# Patient Record
Sex: Male | Born: 1955 | Race: White | Hispanic: No | Marital: Married | Smoking: Current every day smoker
Health system: Southern US, Community
[De-identification: ages and names within clinical notes are randomized; demographics above are authoritative.]

## PROBLEM LIST (undated history)

## (undated) DIAGNOSIS — I10 Essential (primary) hypertension: Secondary | ICD-10-CM

## (undated) DIAGNOSIS — E785 Hyperlipidemia, unspecified: Secondary | ICD-10-CM

## (undated) HISTORY — DX: Hyperlipidemia, unspecified: E78.5

## (undated) HISTORY — PX: HERNIA REPAIR: SHX51

## (undated) HISTORY — PX: COLONOSCOPY: SHX174

## (undated) HISTORY — DX: Essential (primary) hypertension: I10

## (undated) HISTORY — PX: CARDIAC SURGERY: SHX584

---

## 1999-11-30 ENCOUNTER — Inpatient Hospital Stay (HOSPITAL_COMMUNITY)
Admission: EM | Admit: 1999-11-30 | Discharge: 1999-12-04 | Payer: Self-pay | Admitting: Thoracic Surgery (Cardiothoracic Vascular Surgery)

## 1999-11-30 ENCOUNTER — Encounter: Payer: Self-pay | Admitting: Thoracic Surgery (Cardiothoracic Vascular Surgery)

## 1999-12-01 ENCOUNTER — Encounter: Payer: Self-pay | Admitting: Thoracic Surgery (Cardiothoracic Vascular Surgery)

## 1999-12-02 ENCOUNTER — Encounter: Payer: Self-pay | Admitting: Thoracic Surgery (Cardiothoracic Vascular Surgery)

## 1999-12-03 ENCOUNTER — Encounter: Payer: Self-pay | Admitting: Thoracic Surgery (Cardiothoracic Vascular Surgery)

## 2007-08-26 DIAGNOSIS — E7800 Pure hypercholesterolemia, unspecified: Secondary | ICD-10-CM | POA: Insufficient documentation

## 2009-07-15 ENCOUNTER — Ambulatory Visit: Payer: Self-pay | Admitting: Gastroenterology

## 2012-05-09 ENCOUNTER — Encounter: Payer: Self-pay | Admitting: Nurse Practitioner

## 2012-05-09 ENCOUNTER — Encounter: Payer: Self-pay | Admitting: Cardiothoracic Surgery

## 2012-06-02 ENCOUNTER — Encounter: Payer: Self-pay | Admitting: Nurse Practitioner

## 2012-06-02 ENCOUNTER — Encounter: Payer: Self-pay | Admitting: Cardiothoracic Surgery

## 2013-07-10 ENCOUNTER — Ambulatory Visit: Payer: Self-pay | Admitting: Family Medicine

## 2013-07-16 ENCOUNTER — Ambulatory Visit: Payer: Self-pay | Admitting: Family Medicine

## 2013-07-22 ENCOUNTER — Ambulatory Visit: Payer: Self-pay | Admitting: Urology

## 2013-07-23 ENCOUNTER — Ambulatory Visit: Payer: Self-pay | Admitting: Urology

## 2013-07-24 ENCOUNTER — Ambulatory Visit: Payer: Self-pay | Admitting: Urology

## 2013-08-07 ENCOUNTER — Ambulatory Visit: Payer: Self-pay | Admitting: Urology

## 2013-08-14 ENCOUNTER — Ambulatory Visit: Payer: Self-pay | Admitting: Urology

## 2013-09-03 ENCOUNTER — Ambulatory Visit: Payer: Self-pay | Admitting: Urology

## 2014-09-07 ENCOUNTER — Ambulatory Visit: Payer: Self-pay | Admitting: Urology

## 2014-10-01 ENCOUNTER — Ambulatory Visit
Admit: 2014-10-01 | Disposition: A | Discharge status: Home / Self Care | Payer: Self-pay | Attending: Urology | Admitting: Urology

## 2014-10-13 ENCOUNTER — Ambulatory Visit
Admit: 2014-10-13 | Disposition: A | Discharge status: Home / Self Care | Payer: Self-pay | Attending: Urology | Admitting: Urology

## 2014-10-30 ENCOUNTER — Ambulatory Visit
Admit: 2014-10-30 | Disposition: A | Discharge status: Home / Self Care | Payer: Self-pay | Attending: Urology | Admitting: Urology

## 2014-11-01 NOTE — Op Note (Signed)
PATIENT NAME:  Martin Franklin, Martin Franklin MR#:  [id number] DATE OF BIRTH:  02-25-1956  DATE OF SERVICE:  10/13/2014.  PREOPERATIVE DIAGNOSIS:  Right proximal ureteral stone.   POSTOPERATIVE DIAGNOSIS:  No stone identified.   PROCEDURE PERFORMED:  Right ureteroscopy, right ureteral stent exchange, right retrograde pyelogram.    ATTENDING SURGEON:  Claris GladdenAshley J. Kalid Ghan, MD.   ANESTHESIA:  General anesthesia.   ESTIMATED BLOOD LOSS:  Minimal.   DRAINS:  A 6 x 26 French double-J ureteral stent on string.   SPECIMENS:  None.   INDICATION:  This is a 59 year old male who previously presented to the Emergency Room with severe-onset right flank pain.  He was also found to have significant leukocytosis.  He was taken for emergent right ureteral stent placement for a 5 mm proximal stone and returns today for definitive management of his stone.  He has not seen the stone pass in the interim.  Risks and benefits of the procedure were explained in detail.  The patient agreed to proceed as planned.   PROCEDURE:  The patient was correctly identified in the preoperative holding area and informed consent was confirmed.  He was brought to the operating theatre and placed on the table in the supine position.  At this time, a universal timeout protocol was performed, and all team members were identified.  Venodyne boots were placed, and he was administered 500 mg of IV Levaquin in the perioperative period.  He was then placed under general anesthesia, repositioned lower on the bed in the dorsolithotomy position, and prepped and draped in the standard surgical fashion.  At this point in time, a rigid 22-French cystoscope was advanced per urethra into the bladder.  Attention was turned to the right side where a ureteral stent could be seen emanating from the right UO.  The distal tip of the stent was grasped and brought out to the level of the urethral meatus.  The stent was then cannulated using a 0.038 Sensor wire up to the level  of the kidney and the stent was removed.  A dual-lumen catheter was then used just within the distal ureter to introduce a second wire (one as a safety wire and the other as a working wire) up to the level of the kidney.  The safety wire was snapped in place.  I then attempted to advance a 7-French flexible ureteroscope up to the level of the kidney but was unable to get it past the ureteral orifice easily.  I then elected to use an access sheath which did slide easily over the wire to the level of the mid ureter.  The inner cannula was removed, and the flexible scope was able to be advanced up to the level of the kidney without difficulty.  At this point in time, a formal pyeloscopy was performed, and each and every calyx was directly visualized.  No stone was identified.  The scope was then backed down to the level of the proximal ureter and contrast was injected creating a map of the calyxes.  There was no hydronephrosis or filling defects noted. Each and every calyx was then directly visualized again using the map to ensure that all aspects of the kidney were directly visualized.  The stone was not identified.  The scope was then slowly backed down the ureter and the sheath was removed under direct visualization.  During this process, careful inspection of the ureter revealed absolutely no stones.  There was some friable and edematous ureteral mucosa but no  stone.  The scope and the sheath were completely removed at this point. The rigid cystoscope was then advanced back into the bladder to ensure that the ureter had no filling defects.  A second retrograde pyelogram was performed by introducing a 5-French open-ended ureteral catheter just within the UO.  Again, the ureter appeared delicate without any filling defects and no evidence of residual stone.  A 6 x 26 French double-J ureteral stent was then advanced over the wire to the level of the kidney.  The wire was partially withdrawn and a coil was noted within  the renal pelvis.  The wire was then fully withdrawn and coil was noted within the bladder.  The string was left on the stent.  The stent was left in place due to some mild mucosal bleeding and edema.  The bladder was drained.  The scope was removed.  The stent string was affixed to the glans penis using Mastisol and Tegaderm.  The patient was then repositioned in the supine position, reversed from anesthesia, and taken to the PACU in stable condition.  There were no complications in this case.      ____________________________ Claris Gladden, MD ajb:kc Franklin: 10/13/2014 16:41:35 ET T: 10/13/2014 17:01:32 ET JOB#: [id number]  cc: Claris Gladden, MD, <Dictator> Claris Gladden MD ELECTRONICALLY SIGNED 10/13/2014 18:20

## 2014-11-01 NOTE — Op Note (Signed)
PATIENT NAME:  Martin Franklin, Martin Franklin MR#:  [id number] DATE OF BIRTH:  Jun 11, 1956  DATE OF PROCEDURE:  09/07/2014  PREOPERATIVE DIAGNOSIS: Right ureteral stone, leukocytosis.   POSTOPERATIVE DIAGNOSIS:  Right ureteral stone, leukocytosis.  PROCEDURE PERFORMED: Cystoscopy,  right ureteral stent placement.   ATTENDING SURGEON: Claris GladdenAshley J. Ailani Governale, MD   ANESTHESIA: General anesthesia.   ESTIMATED BLOOD LOSS: Minimal.   DRAINS: A 6 x 26 French double-J ureteral stent on right.   COMPLICATIONS: None.   INDICATION: This is a 59 year old male who presented to the Emergency Room with severe acute onset of right flank pain, found to have a 5 mm proximal ureteral stone with moderate hydronephrosis, perinephric stranding and a leukocytosis to 20.  He was counseled on his various treatment options and elects to undergo ureteral stent placement. Risks and benefits of the procedure were explained in detail with the patient who agreed to proceed with planned procedure.   PROCEDURE IN DETAIL: The patient was correctly identified in preoperative holding area  and informed consent was confirmed. He was brought to the operating suite, placed on table in  supine position. At this time, universal timeout protocols were performed. All team members were identified, Venodyne boots placed, and he was administered 500 mg of IV Levaquin in the perioperative period. He was then placed under general anesthesia, repositioned lower on the bed in the dorsal lithotomy position and prepped and draped in standard surgical fashion.   At this point in time, the 5822 French rigid cystoscope was advanced per urethra into the bladder. Attention was turned to the right ureteral orifice which is cannulated using a 5 [place]French open-ended ureteral catheter and a sensor wire.  The sensor wire was able to be placed up to the kidney without difficulty. The 5 [place]French open-ended ureteral catheter was advanced up to the level of the UPJ and aspiration was  obtained and sent off for right urine culture. Of note, this urine was relatively clear, minimally purulent, and there was no evidence of purulent efflux from the right UO.   The 5 [place]French open-ended ureteral catheter was removed then, leaving only the sensor wire in place. A 6 x 26 French double-J ureteral stent was advanced over the wire to the level of the renal pelvis. The wire was partially withdrawn. A coil was noted within the renal pelvis. The wire was then fully withdrawn, a coil was noted within the bladder. The string was not left on the stent.   The patient's bladder was drained. The patient was repositioned in the supine position, reversed from anesthesia, and taken to the PACU in stable condition. There were no complications in this case.     ____________________________ Claris GladdenAshley J. Nivea Wojdyla, MD ajb:tr Franklin: 09/10/2014 11:12:29 ET T: 09/10/2014 11:40:41 ET JOB#: [id number]  cc: Claris GladdenAshley J. Maisie Hauser, MD, <Dictator> Claris GladdenASHLEY J Kinnedy Mongiello MD ELECTRONICALLY SIGNED 10/07/2014 16:21

## 2014-11-01 NOTE — Consult Note (Signed)
PATIENT NAME:  Martin Franklin, Martin Franklin MR#:  [id number] DATE OF BIRTH:  1955-07-16  DATE OF CONSULTATION:  09/07/2014  REFERRING PHYSICIAN:  Sharyn Creamer, MD (ED)  CONSULTING PHYSICIAN:  Claris Gladden, MD, urology.  REASON FOR CONSULTATION: Right ureteral stone, leukocytosis.   REQUESTING PHYSICIAN: The Emergency Room.   HISTORY OF PRESENT ILLNESS: This is a 59 year old male with a history of nephrolithiasis who presented with acute right flank pain.  The pain first started this morning around 10:00 a.m. associated with nausea and vomiting.  The pain was severe and poorly controlled and therefore he presented to the Emergency Room.  The pain was in the right flank but also radiated to the right lower quadrant.   Laboratory data revealed a significant leukocytosis of 20.1 with an increased neutrophil count.  His creatinine was 1.13.  Urinalysis was fairly unremarkable other than for 6 red blood cells and 2 white blood cells.  CT scan revealed a 5 mm proximal right ureteral stone with evidence of perinephric stranding.   The patient does have a known history of nephrolithiasis and was previously followed by Dr. Achilles Dunk of [facility] urology.  He underwent a left ESWL last year but had no stones prior to a year ago. He denies any urinary symptoms today including gross hematuria, urinary frequency or urgency. No fevers or chills.   PAST MEDICAL HISTORY:  1.  CAD status post CABG.  2.  Hypercholesterolemia.  3.  GERD.   PAST SURGICAL HISTORY:  1.  CABG. 2.  Inguinal hernia repair.  3.  ESWL.   MEDICATIONS:   1.  Unknown cholesterol medication 1 tablet daily.  2.  Centrum Silver daily vitamin 1 tab p.o. daily.  3.  Aspirin 81 mg p.o. daily.   ALLERGIES: No known drug allergies.   SOCIAL HISTORY:  He is a current smoker. He denies any excessive alcohol use and no illicit drug use. He presents today to the Emergency Room with his family.   REVIEW OF SYSTEMS:  An extensive 12 point review of systems was  performed and is negative other than as per history of present illness.   PHYSICAL EXAMINATION:  VITAL SIGNS:  Temperature 98.4, pulse 70, respirations 18, blood pressure 147/97, pulse oximetry 90% on room air.  GENERAL: No acute distress, alert and oriented x 3.  HEENT: Extraocular movements intact. Moist mucous membranes.  NECK: Supple. No masses.  Trachea is midline.  CHEST: Clear to auscultation bilaterally. No increased work of breathing or use of accessory muscles.  CARDIOVASCULAR: Regular rate and rhythm.  ABDOMEN: Soft, nontender, nondistended.  Mild right CVA tenderness and voluntary guarding in the right lower quadrant. No left CVA tenderness. NEUROLOGIC:  Cranial nerves grossly intact.  No focal deficits. PSYCHIATRIC: Appropriate affect interacting pleasantly. SKIN:  No rashes lesions or bruises.  EXTREMITIES: No lower extremity edema bilaterally.  LYMPHATICS: No cervical or inguinal lymphadenopathy.   LABORATORY DATA: As per history of present illness, CT scan was personally reviewed by myself which indeed did show a right obstructing 5 mm proximal right ureteral stone with right perinephric stranding and small fluid. The study is otherwise unremarkable.   ASSESSMENT AND PLAN: This is a 59 year old male with a 5 mm right proximal ureteral stone and leukocytosis to 20, given his poorly controlled pain as well leukocytosis, I do feel that placement of a right ureteral stent is warranted to both help with pain control, but as well as drain a possibly infected right kidney. We reviewed all of the  risks and benefits of the procedure and informed consent was obtained.  He understands that he will ultimately need a definitive procedure for his stone.  We discussed postoperative care for a stent as well as common postoperative symptoms including urinary frequency, urgency, hematuria and possible pain or dysuria with voiding.  He is willing to proceed as planned.  IV antibiotics are on-call to  the operating room.       ____________________________ Claris GladdenAshley J. Otha Rickles, MD ajb:DT D: 09/07/2014 18:43:30 ET T: 09/07/2014 19:43:53 ET JOB#: [id number]  cc: Claris GladdenAshley J. Bexlee Bergdoll, MD, <Dictator> Claris GladdenASHLEY J Faydra Korman MD ELECTRONICALLY SIGNED 10/13/2014 17:48

## 2015-02-17 ENCOUNTER — Other Ambulatory Visit: Payer: Self-pay

## 2015-02-17 DIAGNOSIS — N200 Calculus of kidney: Secondary | ICD-10-CM

## 2015-08-12 ENCOUNTER — Other Ambulatory Visit: Payer: Self-pay | Admitting: Family Medicine

## 2015-08-12 DIAGNOSIS — E7800 Pure hypercholesterolemia, unspecified: Secondary | ICD-10-CM

## 2015-08-12 NOTE — Telephone Encounter (Signed)
Lipids last checked March, 2015. Has been seen for acute visits in 2016 before change to Epic. Allene Dillon, CMA

## 2015-11-06 ENCOUNTER — Other Ambulatory Visit: Payer: Self-pay | Admitting: Family Medicine

## 2015-11-06 DIAGNOSIS — E7800 Pure hypercholesterolemia, unspecified: Secondary | ICD-10-CM

## 2015-11-12 ENCOUNTER — Ambulatory Visit: Payer: Self-pay | Admitting: Urology

## 2015-11-15 ENCOUNTER — Other Ambulatory Visit: Payer: Self-pay | Admitting: Family Medicine

## 2015-12-31 ENCOUNTER — Other Ambulatory Visit: Payer: Self-pay | Admitting: Family Medicine

## 2015-12-31 ENCOUNTER — Telehealth: Payer: Self-pay | Admitting: Emergency Medicine

## 2015-12-31 DIAGNOSIS — E7800 Pure hypercholesterolemia, unspecified: Secondary | ICD-10-CM

## 2015-12-31 MED ORDER — ATORVASTATIN CALCIUM 40 MG PO TABS: 40.0000 mg | ORAL_TABLET | Freq: Every day | ORAL | Status: DC

## 2015-12-31 NOTE — Telephone Encounter (Signed)
Pt is a Martin Franklin pt and is wanting to see you, he is needing a refill on his Atorvastatin and is completely out. Wife reports that she is going to schedule a wellness as they are due for employment any ways but would like to know if you will call in a 1 month supply until he can get in. Thanks.    [facility]

## 2015-12-31 NOTE — Telephone Encounter (Signed)
Pt informed

## 2015-12-31 NOTE — Telephone Encounter (Signed)
One month refill sent in

## 2015-12-31 NOTE — Telephone Encounter (Signed)
Last refill says patient was told he must have an office visit for anymore refills. He can have 30 tablets only. He must be seen by someone for any further refills.

## 2016-02-01 ENCOUNTER — Other Ambulatory Visit: Payer: Self-pay | Admitting: Family Medicine

## 2016-02-01 ENCOUNTER — Telehealth: Payer: Self-pay | Admitting: Family Medicine

## 2016-02-01 DIAGNOSIS — E7800 Pure hypercholesterolemia, unspecified: Secondary | ICD-10-CM

## 2016-02-01 MED ORDER — ATORVASTATIN CALCIUM 40 MG PO TABS: 40.0000 mg | ORAL_TABLET | Freq: Every day | ORAL | 0 refills | Status: DC

## 2016-02-01 NOTE — Telephone Encounter (Signed)
Refill request, KW 

## 2016-02-01 NOTE — Telephone Encounter (Signed)
Pt contacted office for refill request on the following medications:  atorvastatin (LIPITOR) 40 MG tablet.  [facility].  Pt has and appointment 02/04/16.  CB#[phone]/MW

## 2016-02-04 ENCOUNTER — Encounter: Payer: Self-pay | Admitting: Family Medicine

## 2016-02-04 ENCOUNTER — Ambulatory Visit (INDEPENDENT_AMBULATORY_CARE_PROVIDER_SITE_OTHER): Payer: Managed Care, Other (non HMO) | Admitting: Family Medicine

## 2016-02-04 VITALS — BP 140/80 | HR 74 | Temp 98.3°F | Resp 18 | Ht 70.0 in | Wt 215.6 lb

## 2016-02-04 DIAGNOSIS — I2510 Atherosclerotic heart disease of native coronary artery without angina pectoris: Secondary | ICD-10-CM | POA: Insufficient documentation

## 2016-02-04 DIAGNOSIS — Z87442 Personal history of urinary calculi: Secondary | ICD-10-CM

## 2016-02-04 DIAGNOSIS — Z0000 Encounter for general adult medical examination without abnormal findings: Secondary | ICD-10-CM | POA: Diagnosis not present

## 2016-02-04 DIAGNOSIS — Z720 Tobacco use: Secondary | ICD-10-CM | POA: Insufficient documentation

## 2016-02-04 DIAGNOSIS — R030 Elevated blood-pressure reading, without diagnosis of hypertension: Secondary | ICD-10-CM

## 2016-02-04 DIAGNOSIS — I10 Essential (primary) hypertension: Secondary | ICD-10-CM | POA: Insufficient documentation

## 2016-02-04 NOTE — Patient Instructions (Signed)
Discussed regular exercise 30 minutes daily. We will call you with the lab results.

## 2016-02-04 NOTE — Progress Notes (Signed)
Subjective:     Patient ID: Martin Franklin, male   DOB: Mar 05, 1956, 60 y.o.   MRN: [id number]  HPI  Chief Complaint  Patient presents with  . Annual Exam    Patient comes into office today for his anuual physical, he states that he has no questions or concerns today. Patient states that he is eating and sleeping well, he reports that he is actively exercising.   Works at [employer]. No other regular exercise.   Review of Systems General: Feeling well HEENT:edentulous with dentures; no recent eye exam but encouraged to do so. Cardiovascular: no chest pain, shortness of breath, or palpitations Respiratory: Continues to smoke and enjoys it. No desire to quit. GI: no heartburn, no change in bowel habits or blood in the stool. Normal colonoscopy in 2011. GU: nocturia x 0 no change in bladder habits  Psychiatric: not depressed Neurology: Reports one episode of transient right thigh paresthesias 3 months ago while working at a part time job. Musculoskeletal: no joint pain    Objective:   Physical Exam  Constitutional: He appears well-developed and well-nourished. No distress.  Eyes: PERRLA, EOMI Neck: no thyromegaly, tenderness or nodules, no cervical adenopathy or carotid bruits ENT: TM's intact without inflammation; No tonsillar enlargement or exudate, Lungs: Clear Heart : RRR without murmur or gallop Abd: bowel sounds present, soft, non-tender, no organomegaly Rectal: Prostate firm and non-tender-felt with tip of examiner's finger Extremities: no edema Skin: no atypical lesions noted on his back.     Assessment:    1. Annual physical exam - Comprehensive metabolic panel - Lipid panel - HgB A1c - PSA    Plan:    Further f/u pending lab work. Encourage regular exercise.

## 2016-02-05 LAB — COMPREHENSIVE METABOLIC PANEL
A/G RATIO: 1.7 (ref 1.2–2.2)
ALK PHOS: 127 IU/L — AB (ref 39–117)
ALT: 23 IU/L (ref 0–44)
AST: 23 IU/L (ref 0–40)
Albumin: 4.2 g/dL (ref 3.5–5.5)
BUN/Creatinine Ratio: 18 (ref 9–20)
BUN: 18 mg/dL (ref 6–24)
Bilirubin Total: 0.3 mg/dL (ref 0.0–1.2)
CO2: 20 mmol/L (ref 18–29)
Calcium: 8.7 mg/dL (ref 8.7–10.2)
Chloride: 102 mmol/L (ref 96–106)
Creatinine, Ser: 0.98 mg/dL (ref 0.76–1.27)
GFR calc Af Amer: 97 mL/min/{1.73_m2} (ref >59)
GFR calc non Af Amer: 84 mL/min/{1.73_m2} (ref >59)
GLOBULIN, TOTAL: 2.5 g/dL (ref 1.5–4.5)
Glucose: 98 mg/dL (ref 65–99)
POTASSIUM: 3.9 mmol/L (ref 3.5–5.2)
SODIUM: 138 mmol/L (ref 134–144)
Total Protein: 6.7 g/dL (ref 6.0–8.5)

## 2016-02-05 LAB — HEMOGLOBIN A1C
Est. average glucose Bld gHb Est-mCnc: 111 mg/dL
HEMOGLOBIN A1C: 5.5 % (ref 4.8–5.6)

## 2016-02-05 LAB — LIPID PANEL
CHOLESTEROL TOTAL: 140 mg/dL (ref 100–199)
Chol/HDL Ratio: 4 ratio units (ref 0.0–5.0)
HDL: 35 mg/dL — ABNORMAL LOW (ref >39)
LDL Calculated: 77 mg/dL (ref 0–99)
TRIGLYCERIDES: 140 mg/dL (ref 0–149)
VLDL Cholesterol Cal: 28 mg/dL (ref 5–40)

## 2016-02-05 LAB — PSA: PROSTATE SPECIFIC AG, SERUM: 0.8 ng/mL (ref 0.0–4.0)

## 2016-02-07 ENCOUNTER — Other Ambulatory Visit: Payer: Self-pay | Admitting: Family Medicine

## 2016-02-07 ENCOUNTER — Telehealth: Payer: Self-pay

## 2016-02-07 DIAGNOSIS — E785 Hyperlipidemia, unspecified: Secondary | ICD-10-CM

## 2016-02-07 MED ORDER — ATORVASTATIN CALCIUM 80 MG PO TABS: 80.0000 mg | ORAL_TABLET | Freq: Every day | ORAL | 3 refills | Status: DC

## 2016-02-07 NOTE — Telephone Encounter (Signed)
Advised patient he would like his prescription sent over to pharmacy. KW

## 2016-02-07 NOTE — Telephone Encounter (Signed)
Unable to reach patient at this time, voicemail box is not set up yet, will attempt to try and reach patient again this afternoon. KW

## 2016-02-07 NOTE — Telephone Encounter (Signed)
Increased atorvastatin to 80 mg.

## 2016-02-07 NOTE — Telephone Encounter (Signed)
-----   Message from Anola Gurneyobert Chauvin, [place] sent at 02/07/2016  7:53 AM EDT ----- Your labs look ok except your LDL cholesterol is not quite at goal < 70 for someone with cardiovascular disease. Would recommend we increased your atorvastatin to 80 mg. If you agree.

## 2016-02-11 ENCOUNTER — Other Ambulatory Visit: Payer: Self-pay | Admitting: Family Medicine

## 2016-02-11 DIAGNOSIS — E785 Hyperlipidemia, unspecified: Secondary | ICD-10-CM

## 2016-02-11 MED ORDER — ATORVASTATIN CALCIUM 80 MG PO TABS: 80.0000 mg | ORAL_TABLET | Freq: Every day | ORAL | 3 refills | Status: DC

## 2016-02-11 NOTE — Telephone Encounter (Signed)
Patient's Lipitor was changed to 80 mg this week.  He needs this called into [facility] please

## 2016-02-11 NOTE — Telephone Encounter (Signed)
done

## 2016-02-28 ENCOUNTER — Other Ambulatory Visit: Payer: Self-pay | Admitting: Family Medicine

## 2016-02-28 DIAGNOSIS — E7800 Pure hypercholesterolemia, unspecified: Secondary | ICD-10-CM

## 2016-12-02 ENCOUNTER — Other Ambulatory Visit: Payer: Self-pay | Admitting: Family Medicine

## 2016-12-02 DIAGNOSIS — E785 Hyperlipidemia, unspecified: Secondary | ICD-10-CM

## 2017-03-02 ENCOUNTER — Ambulatory Visit (INDEPENDENT_AMBULATORY_CARE_PROVIDER_SITE_OTHER): Payer: Managed Care, Other (non HMO) | Admitting: Family Medicine

## 2017-03-02 ENCOUNTER — Encounter: Payer: Self-pay | Admitting: Family Medicine

## 2017-03-02 VITALS — BP 136/92 | HR 83 | Temp 98.1°F | Resp 16 | Ht 69.5 in | Wt 206.4 lb

## 2017-03-02 DIAGNOSIS — E7800 Pure hypercholesterolemia, unspecified: Secondary | ICD-10-CM

## 2017-03-02 DIAGNOSIS — Z125 Encounter for screening for malignant neoplasm of prostate: Secondary | ICD-10-CM | POA: Diagnosis not present

## 2017-03-02 DIAGNOSIS — Z0000 Encounter for general adult medical examination without abnormal findings: Secondary | ICD-10-CM

## 2017-03-02 NOTE — Patient Instructions (Signed)
Please get your bp checked once or twice outside of the office. Goal is at least below 140/90 to  120/80. I will call you once I review your labs.

## 2017-03-02 NOTE — Progress Notes (Signed)
Subjective:     Patient ID: Martin Franklin, male   DOB: [birth year], 61 y.o.   MRN: [id number]  HPI  Chief Complaint  Patient presents with  . Annual Exam    Patient comes in office today for his annual physical he states that he is feeling well today and has no questions or concerns to address. Patient reports that he is following a well balanced diet, walking daily at work, sleeping average of 7hrs a night and reports that his libido is normal. Patient last Tdap was 03/06/12, Colonscopy 07/15/2009, PSA  02/04/16. Patient has declined flu vaccine today and states that he had labs drawn a week ago for biometric screen.   Defers flu shot as he "gets a cold every time I get the shot." Also does not wish the pneumovax. Continues to work at [employer] but wishes to retire at 2062.   Review of Systems General: Feeling well HEENT: edentulous with dentures in use, no recent eye exam and encouraged to do. Cardiovascular: no chest pain, shortness of breath, or palpitations Respiratory: Currently is tapering his cigarette use; currently from 2 packs to < 1 ppd-intends to quit. Reports vivid dreams with nicotine patch and does not use. GI: no heartburn, no change in bowel habits  GU: nocturia x 0, no change in bladder habits  Psychiatric: not depressed Musculoskeletal: no joint pain    Objective:   Physical Exam  Constitutional: He appears well-developed and well-nourished. No distress.  Eyes: PERRLA, EOMI Neck: no thyromegaly, tenderness or nodules, no cervical adenopathy or carotid bruits. ENT: TM's intact without inflammation; No tonsillar enlargement or exudate, Lungs: Clear Heart : RRR without murmur or gallop Abd: bowel sounds present, soft, non-tender, no organomegaly Rectal: Prostate firm and non-tender Extremities: no edema Skin: no atypical lesions noted on his back     Assessment:    1. Annual physical exam  2. Pure hypercholesterolemia: well controlled with medication   3.  Screening for prostate cancer - PSA    Plan:    Discussed bp checks outside of the office. Labs obtained and reviewed after patient departure-all look good- PSA not obtained. Will call patient and discuss labs.

## 2017-03-07 ENCOUNTER — Telehealth: Payer: Self-pay | Admitting: Family Medicine

## 2017-03-07 NOTE — Telephone Encounter (Signed)
Pt. Came in to tell you he had add on labs done today and for future reference the lab keeps the blood 7 days and that we couldve just added the test on to the blood they had.

## 2017-03-08 LAB — PSA: PROSTATE SPECIFIC AG, SERUM: 0.6 ng/mL (ref 0.0–4.0)

## 2017-03-09 ENCOUNTER — Encounter: Payer: Self-pay | Admitting: Family Medicine

## 2017-03-15 ENCOUNTER — Telehealth: Payer: Self-pay

## 2017-03-15 NOTE — Telephone Encounter (Signed)
-----   Message from Anola Gurneyobert Chauvin, [place] sent at 03/08/2017  1:19 PM EDT ----- PSA ok

## 2017-03-15 NOTE — Telephone Encounter (Signed)
Pt advised.   Thanks,   -Laura  

## 2017-04-30 ENCOUNTER — Other Ambulatory Visit: Payer: Self-pay | Admitting: Family Medicine

## 2017-04-30 ENCOUNTER — Telehealth: Payer: Self-pay | Admitting: Family Medicine

## 2017-04-30 MED ORDER — SILDENAFIL CITRATE 50 MG PO TABS: 50.0000 mg | ORAL_TABLET | Freq: Every day | ORAL | 0 refills | Status: AC | PRN

## 2017-04-30 NOTE — Telephone Encounter (Signed)
Please review

## 2017-04-30 NOTE — Telephone Encounter (Signed)
Pt is requesting Martin Franklin return his call. Pt wouldn't discuss why he just wanted to speak with Martin Franklin and that it wasn't an emergency. Please advise. Thanks TNP

## 2017-04-30 NOTE — Telephone Encounter (Signed)
States he has been having trouble sustaining erections. He has been estranged from his wife for 2 months and is getting back together again, Wishes Viagra as an aid. Recent PSA normal. Suggested he may not need this chronically. [facility] to [facility].

## 2017-07-02 ENCOUNTER — Encounter: Payer: Self-pay | Admitting: Family Medicine

## 2017-07-02 ENCOUNTER — Ambulatory Visit: Payer: Managed Care, Other (non HMO) | Admitting: Family Medicine

## 2017-07-02 VITALS — BP 128/82 | HR 84 | Temp 98.5°F | Resp 16 | Ht 70.0 in | Wt 207.0 lb

## 2017-07-02 DIAGNOSIS — J028 Acute pharyngitis due to other specified organisms: Secondary | ICD-10-CM

## 2017-07-02 LAB — POCT RAPID STREP A (OFFICE): Rapid Strep A Screen: NEGATIVE

## 2017-07-02 NOTE — Patient Instructions (Signed)
Discussed use of two Aleve twice daily with food and salt water gargles. Mucinex D for congestion and Delsym for cough.

## 2017-07-02 NOTE — Progress Notes (Signed)
Subjective:     Patient ID: Martin Franklin, male   DOB: [birth year], 61 y.o.   MRN: [id number] Chief Complaint  Patient presents with  . Sore Throat    Patient c/o sore throat X 3 days. He has used throat lozenges to help with his symptoms. He also has trouble swallowing. Denies fever. Mild cough. He also states that it is tender to touch.     HPI Reports contact with sick grandchildren. Minimal sinus congestion and cough. + tobacco use.  Review of Systems     Objective:   Physical Exam  Constitutional: He appears well-developed and well-nourished. No distress.  Ears: T.M's intact without inflammation Throat: no tonsillar enlargement, mild posterior pharyngeal erythema. Neck: bilateral anterior cervical nodes Lungs: clear     Assessment:    1. Pharyngitis due to other organism: suspect early URI - POCT rapid strep A    Plan:    Discussed salt water gargles and Aleve. For congestion Mucinex D and Delsym for cough.

## 2017-11-01 ENCOUNTER — Encounter: Payer: Self-pay | Admitting: Family Medicine

## 2017-11-01 ENCOUNTER — Ambulatory Visit: Payer: Managed Care, Other (non HMO) | Admitting: Family Medicine

## 2017-11-01 VITALS — BP 136/78 | HR 86 | Temp 98.9°F | Resp 16 | Wt 208.0 lb

## 2017-11-01 DIAGNOSIS — B349 Viral infection, unspecified: Secondary | ICD-10-CM

## 2017-11-01 NOTE — Patient Instructions (Signed)
Discussed Mucinex D for congestion and Delsym for cough. Let me know if sinuses not improving over the weekend.

## 2017-11-01 NOTE — Progress Notes (Signed)
  Subjective:     Patient ID: Martin Franklin, male   DOB: 06-Oct-1955, 62 y.o.   MRN: [id number] Chief Complaint  Patient presents with  . Joint Pain    Patient complains of pain in his knees, elbow and shoulder for the past 24hrs, patient describes it as being stiff.  . Cough    Patient complaints of productive cough with mucous and shortness of breath for the past 5 days, patient denies taking any otc cough suppresant or medication.    HPI Reports sinus congestion and sputum are clear. Feels fatigued but no fever. Has slightly decreased smoking while ill.  Review of Systems     Objective:   Physical Exam  Constitutional: He appears well-developed and well-nourished. No distress.  Ears:  LeftT.M. intact without inflammation. Right obscured by cerumen Throat: no tonsillar enlargement or exudate Neck: no cervical adenopathy Lungs: clear     Assessment:    1. Acute viral syndrome     Plan:    Work excuse for 5/2-11/03/17. Discussed use of Mucinex D, Delsym. He has left over rx cough syrup.

## 2018-01-07 ENCOUNTER — Encounter: Payer: Self-pay | Admitting: Family Medicine

## 2018-01-07 ENCOUNTER — Ambulatory Visit (INDEPENDENT_AMBULATORY_CARE_PROVIDER_SITE_OTHER): Payer: Managed Care, Other (non HMO) | Admitting: Family Medicine

## 2018-01-07 VITALS — BP 150/92 | HR 81 | Temp 98.4°F | Resp 16 | Wt 213.2 lb

## 2018-01-07 DIAGNOSIS — R252 Cramp and spasm: Secondary | ICD-10-CM | POA: Diagnosis not present

## 2018-01-07 NOTE — Patient Instructions (Signed)
We will call you with the lab results. 

## 2018-01-07 NOTE — Progress Notes (Signed)
  Subjective:     Patient ID: Martin Franklin, male   DOB: Feb 04, 1956, 62 y.o.   MRN: [id number] Chief Complaint  Patient presents with  . Hand Problem    Patient states for the past 2-3 weeks he has had issues with his left hand trying to "lock", patient states that it last under a minute but recalls a isolated incident a year ago when his hand locked he states that he was unable to close his fist or move his fingers. Patient denies symptoms of numbness or tingling when these episodes occur.    HPI States episodes have occurred nearly daily in the last 2-3 weeks. He will feel the cramping/locking coming on and will massage his hand and it will get better. He works full time at [employer].  Review of Systems     Objective:   Physical Exam  Constitutional: He appears well-developed and well-nourished. No distress.  Musculoskeletal:  Grip strength 5/5. Phalen's and De Quervain's testing negative.       Assessment:    1. Cramping of hands: suspect due to overuse - Magnesium - Renal function panel    Plan:    Consider orthopedic and/or neurology referral.

## 2018-01-08 ENCOUNTER — Telehealth: Payer: Self-pay

## 2018-01-08 LAB — RENAL FUNCTION PANEL
ALBUMIN: 4 g/dL (ref 3.6–4.8)
BUN/Creatinine Ratio: 14 (ref 10–24)
BUN: 11 mg/dL (ref 8–27)
CO2: 23 mmol/L (ref 20–29)
CREATININE: 0.8 mg/dL (ref 0.76–1.27)
Calcium: 9 mg/dL (ref 8.6–10.2)
Chloride: 102 mmol/L (ref 96–106)
GFR, EST AFRICAN AMERICAN: 111 mL/min/{1.73_m2} (ref >59)
GFR, EST NON AFRICAN AMERICAN: 96 mL/min/{1.73_m2} (ref >59)
Glucose: 93 mg/dL (ref 65–99)
PHOSPHORUS: 3.6 mg/dL (ref 2.5–4.5)
Potassium: 3.9 mmol/L (ref 3.5–5.2)
Sodium: 139 mmol/L (ref 134–144)

## 2018-01-08 LAB — MAGNESIUM: MAGNESIUM: 2.2 mg/dL (ref 1.6–2.3)

## 2018-01-08 NOTE — Telephone Encounter (Signed)
Unable to reach patient at this time phone continually rings will try again at a later time. KW

## 2018-01-08 NOTE — Telephone Encounter (Signed)
Patient advised, please proceed with referral to Ortho. KW

## 2018-01-08 NOTE — Telephone Encounter (Signed)
-----   Message from Anola Gurneyobert Chauvin, [place] sent at 01/08/2018  7:20 AM EDT ----- Labs were good. Do you wish to see an orthopedic doctor?

## 2018-01-09 ENCOUNTER — Other Ambulatory Visit: Payer: Self-pay | Admitting: Family Medicine

## 2018-01-09 DIAGNOSIS — R252 Cramp and spasm: Secondary | ICD-10-CM

## 2018-01-09 NOTE — Telephone Encounter (Signed)
Referral in progress. 

## 2018-03-20 ENCOUNTER — Other Ambulatory Visit: Payer: Self-pay | Admitting: Family Medicine

## 2018-03-20 ENCOUNTER — Telehealth: Payer: Self-pay | Admitting: Family Medicine

## 2018-03-20 DIAGNOSIS — E785 Hyperlipidemia, unspecified: Secondary | ICD-10-CM

## 2018-03-20 NOTE — Telephone Encounter (Signed)
°  Pt is almost out of his atorvastatin (LIPITOR) 80 MG tablet   [facility] [address]  [place]

## 2018-03-21 NOTE — Telephone Encounter (Signed)
Prescription has been sent to pharmacy. KW 

## 2018-04-22 ENCOUNTER — Encounter: Payer: Self-pay | Admitting: Family Medicine

## 2018-04-22 ENCOUNTER — Ambulatory Visit (INDEPENDENT_AMBULATORY_CARE_PROVIDER_SITE_OTHER): Payer: Managed Care, Other (non HMO) | Admitting: Family Medicine

## 2018-04-22 VITALS — BP 130/84 | HR 80 | Temp 98.4°F | Resp 16 | Wt 212.0 lb

## 2018-04-22 DIAGNOSIS — Z716 Tobacco abuse counseling: Secondary | ICD-10-CM | POA: Diagnosis not present

## 2018-04-22 NOTE — Progress Notes (Addendum)
  Subjective:     Patient ID: Martin Franklin, male   DOB: 05/28/1956, 62 y.o.   MRN: [id number] Chief Complaint  Patient presents with  . Nicotine Dependence    Patient comes in office todayy with [facility] Appeal form for biometric screening. Patient would like to address options to help him quit smoking and to help decrease his BMI.    HPI States he is smoking 1 ppd. Has tried nicotine patches which give him vivid dreams. Nicotine gum does not taste good to him. Does not wish to be on rx medication. Currently working for [employer]. States he deer hunts and walks then for exercise.  Review of Systems     Objective:   Physical Exam  Constitutional: He appears well-developed and well-nourished. No distress.       Assessment:    1. Encounter for smoking cessation counseling    Plan:    Discussed cigarette taper at 2/week. Pamphlet regarding [facility] quit smoking classes and tobacco use disorder. Recommended walking 30 minutes daily and cutting down on Pepsi consumption. Handout about Mediterranean diet. Appeal form completed.

## 2018-04-22 NOTE — Patient Instructions (Addendum)
Tobacco Use Disorder Tobacco use disorder (TUD) is a mental disorder. It is the long-term use of tobacco in spite of related health problems or difficulty with normal life activities. Tobacco is most commonly smoked as cigarettes and less commonly as cigars or pipes. Smokeless chewing tobacco and snuff are also popular. People with TUD get a feeling of extreme pleasure (euphoria) from using tobacco and have a desire to use it again and again. Repeated use of tobacco can cause problems. The addictive effects of tobacco are due mainly tothe ingredient nicotine. Nicotine also causes a rush of adrenaline (epinephrine) in the body. This leads to increased blood pressure, heart rate, and breathing rate. These changes may cause problems for people with high blood pressure, weak hearts, or lung disease. High doses of nicotine in children and pets can lead to seizures and death. Tobacco contains a number of other unsafe chemicals. These chemicals are especially harmful when inhaled as smoke and can damage almost every organ in the body. Smokers live shorter lives than nonsmokers and are at risk of dying from a number of diseases and cancers. Tobacco smoke can also cause health problems for nonsmokers (due to inhaling secondhand smoke). Smoking is also a fire hazard. TUD usually starts in the late teenage years and is most common in young adults between the ages of 18 and 25 years. People who start smoking earlier in life are more likely to continue smoking as adults. TUD is somewhat more common in men than women. People with TUD are at higher risk for using alcohol and other drugs of abuse. What increases the risk? Risk factors for TUD include:  Having family members with the disorder.  Being around people who use tobacco.  Having an existing mental health issue such as schizophrenia, depression, bipolar disorder, ADHD, or posttraumatic stress disorder (PTSD).  What are the signs or symptoms? People with  tobacco use disorder have two or more of the following signs and symptoms within 12 months:  Use of more tobacco over a longer period than intended.  Not able to cut down or control tobacco use.  A lot of time spent obtaining or using tobacco.  Strong desire or urge to use tobacco (craving). Cravings may last for 6 months or longer after quitting.  Use of tobacco even when use leads to major problems at work, school, or home.  Use of tobacco even when use leads to relationship problems.  Giving up or cutting down on important life activities because of tobacco use.  Repeatedly using tobacco in situations where it puts you or others in physical danger, like smoking in bed.  Use of tobacco even when it is known that a physical or mental problem is likely related to tobacco use. ? Physical problems are numerous and may include chronic bronchitis, emphysema, lung and other cancers, gum disease, high blood pressure, heart disease, and stroke. ? Mental problems caused by tobacco may include difficulty sleeping and anxiety.  Need to use greater amounts of tobacco to get the same effect. This means you have developed a tolerance.  Withdrawal symptoms as a result of stopping or rapidly cutting back use. These symptoms may last a month or more after quitting and include the following: ? Depressed, anxious, or irritable mood. ? Difficulty concentrating. ? Increased appetite. ? Restlessness or trouble sleeping. ? Use of tobacco to avoid withdrawal symptoms.  How is this diagnosed? Tobacco use disorder is diagnosed by your health care provider. A diagnosis may be made by:    Your health care provider asking questions about your tobacco use and any problems it may be causing.  A physical exam.  Lab tests.  You may be referred to a mental health professional or addiction specialist.  The severity of tobacco use disorder depends on the number of signs and symptoms you have:  Mild-Two or  three symptoms.  Moderate-Four or five symptoms.  Severe-Six or more symptoms.  How is this treated? Many people with tobacco use disorder are unable to quit on their own and need help. Treatment options include the following:  Nicotine replacement therapy (NRT). NRT provides nicotine without the other harmful chemicals in tobacco. NRT gradually lowers the dosage of nicotine in the body and reduces withdrawal symptoms. NRT is available in over-the-counter forms (gum, lozenges, and skin patches) as well as prescription forms (mouth inhaler and nasal spray).  Medicines.This may include: ? Antidepressant medicine that may reduce nicotine cravings. ? A medicine that acts on nicotine receptors in the brain to reduce cravings and withdrawal symptoms. It may also block the effects of tobacco in people with TUD who relapse.  Counseling or talk therapy. A form of talk therapy called behavioral therapy is commonly used to treat people with TUD. Behavioral therapy looks at triggers for tobacco use, how to avoid them, and how to cope with cravings. It is most effective in person or by phone but is also available in self-help forms (books and Internet websites).  Support groups. These provide emotional support, advice, and guidance for quitting tobacco.  The most effective treatment for TUD is usually a combination of medicine, talk therapy, and support groups. Follow these instructions at home:  Keep all follow-up visits as directed by your health care provider. This is important.  Take medicines only as directed by your health care provider.  Check with your health care provider before starting new prescription or over-the-counter medicines. Contact a health care provider if:  You are not able to take your medicines as prescribed.  Treatment is not helping your TUD and your symptoms get worse. Get help right away if:  You have serious thoughts about hurting yourself or others.  You have  trouble breathing, chest pain, sudden weakness, or sudden numbness in part of your body. This information is not intended to replace advice given to you by your health care provider. Make sure you discuss any questions you have with your health care provider. Document Released: 02/23/2004 Document Revised: 02/20/2016 Document Reviewed: 08/15/2013 Elsevier Interactive Patient Education  2018 [employer].  Discussed cigarette taper 2/week and Quit smoking web site or hot line. Starting walking 30 minutes daily and a Mediterranean diet. Mediterranean Diet A Mediterranean diet refers to food and lifestyle choices that are based on the traditions of countries located on the [place]. This way of eating has been shown to help prevent certain conditions and improve outcomes for people who have chronic diseases, like kidney disease and heart disease. What are tips for following this plan? Lifestyle Cook and eat meals together with your family, when possible. Drink enough fluid to keep your urine clear or pale yellow. Be physically active every day. This includes: Aerobic exercise like running or swimming. Leisure activities like gardening, walking, or housework. Get 7-8 hours of sleep each night. If recommended by your health care provider, drink red wine in moderation. This means 1 glass a day for nonpregnant women and 2 glasses a day for men. A glass of wine equals 5 oz (150 mL). Reading food  labels Check the serving size of packaged foods. For foods such as rice and pasta, the serving size refers to the amount of cooked product, not dry. Check the total fat in packaged foods. Avoid foods that have saturated fat or trans fats. Check the ingredients list for added sugars, such as corn syrup. Shopping At the grocery store, buy most of your food from the areas near the walls of the store. This includes: Fresh fruits and vegetables (produce). Grains, beans, nuts, and seeds. Some of these  may be available in unpackaged forms or large amounts (in bulk). Fresh seafood. Poultry and eggs. Low-fat dairy products. Buy whole ingredients instead of prepackaged foods. Buy fresh fruits and vegetables in-season from local farmers markets. Buy frozen fruits and vegetables in resealable bags. If you do not have access to quality fresh seafood, buy precooked frozen shrimp or canned fish, such as tuna, salmon, or sardines. Buy small amounts of raw or cooked vegetables, salads, or olives from the deli or salad bar at your store. Stock your pantry so you always have certain foods on hand, such as olive oil, canned tuna, canned tomatoes, rice, pasta, and beans. Cooking Cook foods with extra-virgin olive oil instead of using butter or other vegetable oils. Have meat as a side dish, and have vegetables or grains as your main dish. This means having meat in small portions or adding small amounts of meat to foods like pasta or stew. Use beans or vegetables instead of meat in common dishes like chili or lasagna. Experiment with different cooking methods. Try roasting or broiling vegetables instead of steaming or sauteing them. Add frozen vegetables to soups, stews, pasta, or rice. Add nuts or seeds for added healthy fat at each meal. You can add these to yogurt, salads, or vegetable dishes. Marinate fish or vegetables using olive oil, lemon juice, garlic, and fresh herbs. Meal planning Plan to eat 1 vegetarian meal one day each week. Try to work up to 2 vegetarian meals, if possible. Eat seafood 2 or more times a week. Have healthy snacks readily available, such as: Vegetable sticks with hummus. Greek yogurt. Fruit and nut trail mix. Eat balanced meals throughout the week. This includes: Fruit: 2-3 servings a day Vegetables: 4-5 servings a day Low-fat dairy: 2 servings a day Fish, poultry, or lean meat: 1 serving a day Beans and legumes: 2 or more servings a week Nuts and seeds: 1-2 servings  a day Whole grains: 6-8 servings a day Extra-virgin olive oil: 3-4 servings a day Limit red meat and sweets to only a few servings a month What are my food choices? Mediterranean diet Recommended Grains: Whole-grain pasta. Brown rice. Bulgar wheat. Polenta. Couscous. Whole-wheat bread. Orpah Cobb. Vegetables: Artichokes. Beets. Broccoli. Cabbage. Carrots. Eggplant. Green beans. Chard. Kale. Spinach. Onions. Leeks. Peas. Squash. Tomatoes. Peppers. Radishes. Fruits: Apples. Apricots. Avocado. Berries. Bananas. Cherries. Dates. Figs. Grapes. Lemons. Melon. Oranges. Peaches. Plums. Pomegranate. Meats and other protein foods: Beans. Almonds. Sunflower seeds. Pine nuts. Peanuts. Cod. Salmon. Scallops. Shrimp. Tuna. Tilapia. Clams. Oysters. Eggs. Dairy: Low-fat milk. Cheese. Greek yogurt. Beverages: Water. Red wine. Herbal tea. Fats and oils: Extra virgin olive oil. Avocado oil. Grape seed oil. Sweets and desserts: [place] yogurt with honey. Baked apples. Poached pears. Trail mix. Seasoning and other foods: Basil. Cilantro. Coriander. Cumin. Mint. Parsley. Sage. Rosemary. Tarragon. Garlic. Oregano. Thyme. Pepper. Balsalmic vinegar. Tahini. Hummus. Tomato sauce. Olives. Mushrooms. Limit these Grains: Prepackaged pasta or rice dishes. Prepackaged cereal with added sugar. Vegetables: Deep fried potatoes (  french fries). Fruits: Fruit canned in syrup. Meats and other protein foods: Beef. Pork. Lamb. Poultry with skin. Hot dogs. Tomasa Blase. Dairy: Ice cream. Sour cream. Whole milk. Beverages: Juice. Sugar-sweetened soft drinks. Beer. Liquor and spirits. Fats and oils: Butter. Canola oil. Vegetable oil. Beef fat (tallow). Lard. Sweets and desserts: Cookies. Cakes. Pies. Candy. Seasoning and other foods: Mayonnaise. Premade sauces and marinades. The items listed may not be a complete list. Talk with your dietitian about what dietary choices are right for you. Summary The Mediterranean diet includes both  food and lifestyle choices. Eat a variety of fresh fruits and vegetables, beans, nuts, seeds, and whole grains. Limit the amount of red meat and sweets that you eat. Talk with your health care provider about whether it is safe for you to drink red wine in moderation. This means 1 glass a day for nonpregnant women and 2 glasses a day for men. A glass of wine equals 5 oz (150 mL). This information is not intended to replace advice given to you by your health care provider. Make sure you discuss any questions you have with your health care provider. Document Released: 02/10/2016 Document Revised: 03/14/2016 Document Reviewed: 02/10/2016 Elsevier Interactive Patient Education  [employer].

## 2018-12-27 ENCOUNTER — Ambulatory Visit: Payer: Managed Care, Other (non HMO) | Admitting: Physician Assistant

## 2018-12-27 ENCOUNTER — Encounter: Payer: Self-pay | Admitting: Physician Assistant

## 2018-12-27 ENCOUNTER — Other Ambulatory Visit: Payer: Self-pay

## 2018-12-27 VITALS — BP 168/101 | HR 84 | Temp 98.5°F | Wt 221.6 lb

## 2018-12-27 DIAGNOSIS — E6609 Other obesity due to excess calories: Secondary | ICD-10-CM

## 2018-12-27 DIAGNOSIS — Z6831 Body mass index (BMI) 31.0-31.9, adult: Secondary | ICD-10-CM | POA: Diagnosis not present

## 2018-12-27 DIAGNOSIS — I10 Essential (primary) hypertension: Secondary | ICD-10-CM

## 2018-12-27 DIAGNOSIS — R202 Paresthesia of skin: Secondary | ICD-10-CM | POA: Diagnosis not present

## 2018-12-27 MED ORDER — PREDNISONE 10 MG (21) PO TBPK: ORAL_TABLET | ORAL | 0 refills | Status: DC

## 2018-12-27 MED ORDER — AMLODIPINE BESYLATE 5 MG PO TABS: 5.0000 mg | ORAL_TABLET | Freq: Every day | ORAL | 3 refills | Status: AC

## 2018-12-27 NOTE — Patient Instructions (Signed)
Paresthesia Paresthesia is an abnormal burning or prickling sensation. It is usually felt in the hands, arms, legs, or feet. However, it may occur in any part of the body. Usually, paresthesia is not painful. It may feel like:  Tingling or numbness.  Buzzing.  Itching. Paresthesia may occur without any clear cause, or it may be caused by:  Breathing too quickly (hyperventilation).  Pressure on a nerve.  An underlying medical condition.  Side effects of a medication.  Nutritional deficiencies.  Exposure to toxic chemicals. Most people experience temporary (transient) paresthesia at some time in their lives. For some people, it may be long-lasting (chronic) because of an underlying medical condition. If you have paresthesia that lasts a long time, you may need to be evaluated by your health care provider. Follow these instructions at home: Alcohol use   Do not drink alcohol if: ? Your health care provider tells you not to drink. ? You are pregnant, may be pregnant, or are planning to become pregnant.  If you drink alcohol: ? Limit how much you use to:  0-1 drink a day for women.  0-2 drinks a day for men. ? Be aware of how much alcohol is in your drink. In the [place] one drink equals one 12 oz bottle of beer (355 mL), one 5 oz glass of wine (148 mL), or one 1 oz glass of hard liquor (44 mL). Nutrition   Eat a healthy diet. This includes: ? Eating foods that are high in fiber, such as fresh fruits and vegetables, whole grains, and beans. ? Limiting foods that are high in fat and processed sugars, such as fried or sweet foods. General instructions  Take over-the-counter and prescription medicines only as told by your health care provider.  Do not use any products that contain nicotine or tobacco, such as cigarettes and e-cigarettes. These can keep blood from reaching damaged nerves. If you need help quitting, ask your health care provider.  If you have diabetes, work  closely with your health care provider to keep your blood sugar under control.  If you have numbness in your feet: ? Check every day for signs of injury or infection. Watch for redness, warmth, and swelling. ? Wear padded socks and comfortable shoes. These help protect your feet.  Keep all follow-up visits as told by your health care provider. This is important. Contact a health care provider if you:  Have paresthesia that gets worse or does not go away.  Have a burning or prickling feeling that gets worse when you walk.  Have pain, cramps, or dizziness.  Develop a rash. Get help right away if you:  Feel weak.  Have trouble walking or moving.  Have problems with speech, understanding, or vision.  Feel confused.  Cannot control your bladder or bowel movements.  Have numbness after an injury.  Develop new weakness in an arm or leg.  Faint. Summary  Paresthesia is an abnormal burning or prickling sensation that is usually felt in the hands, arms, legs, or feet. It may also occur in other parts of the body.  Paresthesia may occur without any clear cause, or it may be caused by breathing too quickly (hyperventilation), pressure on a nerve, an underlying medical condition, side effects of a medication, nutritional deficiencies, or exposure to toxic chemicals.  If you have paresthesia that lasts a long time, you may need to be evaluated by your health care provider. This information is not intended to replace advice given to you by   your health care provider. Make sure you discuss any questions you have with your health care provider. Document Released: 06/09/2002 Document Revised: 07/15/2018 Document Reviewed: 06/28/2017 Elsevier Patient Education  2020 Elsevier Inc.  

## 2018-12-27 NOTE — Progress Notes (Signed)
Patient: Martin ReadDaryl D Rosenfield Male    DOB: [birth year]   63 y.o.   MRN: [id number] Visit Date: 12/27/2018  Today's Provider: Margaretann LovelessJennifer M Foy Vanduyne, PA-C   Chief Complaint  Patient presents with  . Leg numbness   Subjective:     Leg Pain  Incident onset: 1 week ago. The pain is present in the right leg. The patient is experiencing no pain. The pain has been constant since onset. Associated symptoms include numbness. He reports no foreign bodies present. The symptoms are aggravated by weight bearing. He has tried nothing for the symptoms.    No Known Allergies   Current Outpatient Medications:  .  aspirin 81 MG tablet, Take 81 mg by mouth daily., Disp: , Rfl:  .  atorvastatin (LIPITOR) 80 MG tablet, TAKE 1 TABLET BY MOUTH  DAILY, Disp: 90 tablet, Rfl: 3 .  Multiple Vitamins-Minerals (MULTIVITAMIN ADULT PO), Take by mouth., Disp: , Rfl:  .  sildenafil (VIAGRA) 50 MG tablet, Take 1 tablet (50 mg total) by mouth daily as needed for erectile dysfunction. Take 30 minutes to an hour before sexual acitivity (Patient not taking: Reported on 01/07/2018), Disp: 10 tablet, Rfl: 0  Review of Systems  Constitutional: Negative.   Respiratory: Negative.   Cardiovascular: Negative.   Gastrointestinal: Negative.   Musculoskeletal: Negative.   Neurological: Positive for numbness. Negative for weakness.    Social History   Tobacco Use  . Smoking status: Current Every Day Smoker    Packs/day: 1.50    Types: Cigarettes  . Smokeless tobacco: Never Used  Substance Use Topics  . Alcohol use: No      Objective:   BP (!) 168/101 (BP Location: Left Arm, Patient Position: Sitting, Cuff Size: Large)   Pulse 84   Temp 98.5 F (36.9 C) (Oral)   Wt 221 lb 9.6 oz (100.5 kg)   SpO2 97%   BMI 31.80 kg/m  Vitals:   12/27/18 1428  BP: (!) 168/101  Pulse: 84  Temp: 98.5 F (36.9 C)  TempSrc: Oral  SpO2: 97%  Weight: 221 lb 9.6 oz (100.5 kg)     Physical Exam Vitals signs reviewed.   Constitutional:      General: He is not in acute distress.    Appearance: Normal appearance. He is well-developed. He is not ill-appearing or diaphoretic.  HENT:     Head: Normocephalic and atraumatic.  Neck:     Musculoskeletal: Normal range of motion and neck supple.     Thyroid: No thyromegaly.     Vascular: No JVD.     Trachea: No tracheal deviation.  Cardiovascular:     Rate and Rhythm: Normal rate and regular rhythm.     Heart sounds: Normal heart sounds. No murmur. No friction rub. No gallop.   Pulmonary:     Effort: Pulmonary effort is normal. No respiratory distress.     Breath sounds: Normal breath sounds. No wheezing or rales.  Musculoskeletal:     Right hip: Normal.     Left hip: Normal.     Lumbar back: Normal.  Lymphadenopathy:     Cervical: No cervical adenopathy.  Neurological:     Mental Status: He is alert.      No results found for any visits on 12/27/18.     Assessment & Plan    1. Class 1 obesity due to excess calories with serious comorbidity and body mass index (BMI) of 31.0 to 31.9 in adult Counseled patient on  healthy lifestyle modifications including dieting and exercise.   2. Paresthesia Unsure of source since patient is not having any pain nor injury. Suspect possible muscular compression of the nerves along the lateral thigh. I will try prednisone taper as below. Call if not improving.  - predniSONE (STERAPRED UNI-PAK 21 TAB) 10 MG (21) TBPK tablet; 6 day taper; take as directed on package instructions  Dispense: 21 tablet; Refill: 0  3. Essential hypertension Stable. Diagnosis pulled for medication refill. Continue current medical treatment plan. - amLODipine (NORVASC) 5 MG tablet; Take 1 tablet (5 mg total) by mouth daily.  Dispense: 90 tablet; Refill: [place], PA-C  [facility]

## 2018-12-30 ENCOUNTER — Ambulatory Visit: Payer: Managed Care, Other (non HMO) | Admitting: Physician Assistant

## 2019-01-07 ENCOUNTER — Encounter: Payer: Self-pay | Admitting: Physician Assistant

## 2019-01-15 ENCOUNTER — Ambulatory Visit: Payer: Managed Care, Other (non HMO) | Admitting: Physician Assistant

## 2019-01-15 ENCOUNTER — Other Ambulatory Visit: Payer: Self-pay

## 2019-01-15 ENCOUNTER — Encounter: Payer: Self-pay | Admitting: Physician Assistant

## 2019-01-15 VITALS — BP 146/88 | HR 80 | Temp 97.9°F | Resp 16 | Ht 70.0 in | Wt 218.0 lb

## 2019-01-15 DIAGNOSIS — I10 Essential (primary) hypertension: Secondary | ICD-10-CM | POA: Diagnosis not present

## 2019-01-15 DIAGNOSIS — R202 Paresthesia of skin: Secondary | ICD-10-CM

## 2019-01-15 MED ORDER — PREDNISONE 10 MG (21) PO TBPK: ORAL_TABLET | ORAL | 0 refills | Status: AC

## 2019-01-15 NOTE — Progress Notes (Signed)
Patient: Martin Franklin Male    DOB: 1956-01-11   63 y.o.   MRN: [id number] Visit Date: 01/15/2019  Today's Provider: Mar Daring, PA-C   Chief Complaint  Patient presents with  . Follow-up   Subjective:     HPI   Paresthesia From 12/27/2018-given predniSONE (STERAPRED UNI-PAK 21 TAB) 10 MG (21) TBPK tablet; 6 day taper; take as directed on package instructions    Patient states he has had improvement since completing prednisone. Reports leg is 90% better.  Also following up on BP. Restarted amlodipine 5mg  on 12/27/18.   No Known Allergies   Current Outpatient Medications:  .  amLODipine (NORVASC) 5 MG tablet, Take 1 tablet (5 mg total) by mouth daily., Disp: 90 tablet, Rfl: 3 .  aspirin 81 MG tablet, Take 81 mg by mouth daily., Disp: , Rfl:  .  atorvastatin (LIPITOR) 80 MG tablet, TAKE 1 TABLET BY MOUTH  DAILY, Disp: 90 tablet, Rfl: 3 .  Multiple Vitamins-Minerals (MULTIVITAMIN ADULT PO), Take by mouth., Disp: , Rfl:  .  predniSONE (STERAPRED UNI-PAK 21 TAB) 10 MG (21) TBPK tablet, 6 day taper; take as directed on package instructions (Patient not taking: Reported on 01/15/2019), Disp: 21 tablet, Rfl: 0 .  sildenafil (VIAGRA) 50 MG tablet, Take 1 tablet (50 mg total) by mouth daily as needed for erectile dysfunction. Take 30 minutes to an hour before sexual acitivity (Patient not taking: Reported on 01/15/2019), Disp: 10 tablet, Rfl: 0  Review of Systems  Constitutional: Negative for appetite change, chills and fever.  Respiratory: Negative for chest tightness, shortness of breath and wheezing.   Cardiovascular: Negative for chest pain and palpitations.  Gastrointestinal: Negative for abdominal pain, nausea and vomiting.  Musculoskeletal: Negative for arthralgias, back pain and gait problem.  Neurological: Negative for weakness and numbness.    Social History   Tobacco Use  . Smoking status: Current Every Day Smoker    Packs/day: 1.50    Types:  Cigarettes  . Smokeless tobacco: Never Used  Substance Use Topics  . Alcohol use: No      Objective:   BP (!) 146/88 (BP Location: Right Arm, Cuff Size: Large)   Pulse 80   Temp 97.9 F (36.6 C) (Oral)   Resp 16   Ht 5\' 10"  (1.778 m)   Wt 218 lb (98.9 kg)   SpO2 97%   BMI 31.28 kg/m  Vitals:   01/15/19 1137 01/15/19 1150  BP: (!) 166/96 (!) 146/88  Pulse: 81 80  Resp: 16   Temp: 97.9 F (36.6 C)   TempSrc: Oral   SpO2: 97%   Weight: 218 lb (98.9 kg)   Height: 5\' 10"  (1.778 m)      Physical Exam Vitals signs reviewed.  Constitutional:      General: He is not in acute distress.    Appearance: Normal appearance. He is well-developed. He is not ill-appearing or diaphoretic.  HENT:     Head: Normocephalic and atraumatic.  Neck:     Musculoskeletal: Normal range of motion and neck supple.     Thyroid: No thyromegaly.     Vascular: No JVD.     Trachea: No tracheal deviation.  Cardiovascular:     Rate and Rhythm: Normal rate and regular rhythm.     Pulses: Normal pulses.     Heart sounds: Normal heart sounds. No murmur. No friction rub. No gallop.   Pulmonary:     Effort: Pulmonary effort is  normal. No respiratory distress.     Breath sounds: Normal breath sounds. No wheezing or rales.  Musculoskeletal:     Right lower leg: No edema.     Left lower leg: No edema.  Lymphadenopathy:     Cervical: No cervical adenopathy.  Skin:    Capillary Refill: Capillary refill takes less than 2 seconds.  Neurological:     Mental Status: He is alert.      No results found for any visits on 01/15/19.     Assessment & Plan    1. Paresthesia Improved. Will do one more taper to hopefully completely resolve. - predniSONE (STERAPRED UNI-PAK 21 TAB) 10 MG (21) TBPK tablet; 6 day taper; take as directed on package instructions  Dispense: 21 tablet; Refill: 0  2. Essential hypertension Still borderline elevated. Will monitor at home over next 1-2 weeks. If still elevated will  increase amlodipine to 10mg .     Margaretann LovelessJennifer M Burnette, PA-C  [facility]

## 2019-03-13 ENCOUNTER — Telehealth: Payer: Self-pay | Admitting: Physician Assistant

## 2019-03-13 DIAGNOSIS — E785 Hyperlipidemia, unspecified: Secondary | ICD-10-CM

## 2019-03-13 MED ORDER — ATORVASTATIN CALCIUM 80 MG PO TABS: 80.0000 mg | ORAL_TABLET | Freq: Every day | ORAL | 3 refills | Status: AC

## 2019-03-13 NOTE — Telephone Encounter (Signed)
refilled 

## 2019-03-13 NOTE — Telephone Encounter (Signed)
[facility] faxed refill request for the following medications:   atorvastatin (LIPITOR) 80 MG tablet   Please advise.  

## 2019-08-12 ENCOUNTER — Encounter: Payer: Self-pay | Admitting: *Deleted

## 2019-08-12 ENCOUNTER — Telehealth: Payer: Self-pay | Admitting: *Deleted

## 2019-08-12 DIAGNOSIS — Z87891 Personal history of nicotine dependence: Secondary | ICD-10-CM

## 2019-08-12 NOTE — Telephone Encounter (Signed)
Received referral for initial lung cancer screening scan. Contacted patient and obtained smoking history,(current, 49.5 pack year) as well as answering questions related to screening process. Patient denies signs of lung cancer such as weight loss or hemoptysis. Patient denies comorbidity that would prevent curative treatment if lung cancer were found. Patient is scheduled for shared decision making visit and CT scan on 08/21/19.

## 2019-08-21 ENCOUNTER — Inpatient Hospital Stay: Payer: Managed Care, Other (non HMO) | Admitting: Oncology

## 2019-08-21 ENCOUNTER — Ambulatory Visit: Payer: Managed Care, Other (non HMO)

## 2019-08-21 ENCOUNTER — Ambulatory Visit: Payer: Self-pay | Admitting: Nurse Practitioner

## 2019-08-29 LAB — COLOGUARD: COLOGUARD: NEGATIVE

## 2019-08-29 LAB — EXTERNAL GENERIC LAB PROCEDURE: COLOGUARD: NEGATIVE

## 2019-09-05 ENCOUNTER — Other Ambulatory Visit: Payer: Self-pay

## 2019-09-05 ENCOUNTER — Ambulatory Visit (INDEPENDENT_AMBULATORY_CARE_PROVIDER_SITE_OTHER): Payer: Managed Care, Other (non HMO) | Admitting: Vascular Surgery

## 2019-09-05 ENCOUNTER — Encounter (INDEPENDENT_AMBULATORY_CARE_PROVIDER_SITE_OTHER): Payer: Self-pay | Admitting: Vascular Surgery

## 2019-09-05 VITALS — BP 158/94 | HR 98 | Resp 16 | Ht 70.0 in | Wt 217.6 lb

## 2019-09-05 DIAGNOSIS — R200 Anesthesia of skin: Secondary | ICD-10-CM | POA: Diagnosis not present

## 2019-09-05 DIAGNOSIS — I10 Essential (primary) hypertension: Secondary | ICD-10-CM

## 2019-09-05 DIAGNOSIS — R202 Paresthesia of skin: Secondary | ICD-10-CM

## 2019-09-05 DIAGNOSIS — E7800 Pure hypercholesterolemia, unspecified: Secondary | ICD-10-CM | POA: Diagnosis not present

## 2019-09-05 NOTE — Assessment & Plan Note (Signed)
blood pressure control important in reducing the progression of atherosclerotic disease. On appropriate oral medications.  

## 2019-09-05 NOTE — Progress Notes (Signed)
Patient ID: Martin Franklin, male   DOB: 1956-04-07, 64 y.o.   MRN: [id number]  Chief Complaint  Patient presents with  . New Patient (Initial Visit)    ref Meeler orle pain and tingling    HPI Martin Franklin is a 64 y.o. male.  I am asked to see the patient by Lawernce Keas NP for evaluation of numbness and tingling in the right leg.  This has been a intermittent but fairly regular issue now for several months.  To short burst of prednisone did relieve the symptoms for some time.  But as soon as the prednisone stopped, his symptoms returned.  This is only the right leg.  No symptoms in the left leg.  He does not have any back pain or hip pain.  No ulceration or infection.  He says he can walk or run about as much as he wants to in general.  The symptoms are now much less severe than they were previously and he said today they are 1 out of 10 in terms of severity.  He has been referred to neurology as well.     Past Medical History:  Diagnosis Date  . Hyperlipidemia   . Hypertension     Past Surgical History:  Procedure Laterality Date  . CARDIAC SURGERY     double bypass  . COLONOSCOPY    . HERNIA REPAIR       Family History  Problem Relation Age of Onset  . Heart attack Father   No bleeding disorders, clotting disorders, autoimmune diseases, or aneurysms  Social History   Tobacco Use  . Smoking status: Current Every Day Smoker    Packs/day: 1.50    Years: 33.00    Pack years: 49.50    Types: Cigarettes  . Smokeless tobacco: Never Used  Substance Use Topics  . Alcohol use: No  . Drug use: No     No Known Allergies  Current Outpatient Medications  Medication Sig Dispense Refill  . aspirin 81 MG tablet Take 81 mg by mouth daily.    Marland Kitchen atorvastatin (LIPITOR) 80 MG tablet Take 1 tablet (80 mg total) by mouth daily. 90 tablet 3  . Multiple Vitamins-Minerals (MULTIVITAMIN ADULT PO) Take by mouth.    . naproxen sodium (ALEVE) 220 MG tablet Take by mouth.    Marland Kitchen  amLODipine (NORVASC) 5 MG tablet Take 1 tablet (5 mg total) by mouth daily. (Patient not taking: Reported on 09/05/2019) 90 tablet 3  . predniSONE (STERAPRED UNI-PAK 21 TAB) 10 MG (21) TBPK tablet 6 day taper; take as directed on package instructions (Patient not taking: Reported on 09/05/2019) 21 tablet 0  . sildenafil (VIAGRA) 50 MG tablet Take 1 tablet (50 mg total) by mouth daily as needed for erectile dysfunction. Take 30 minutes to an hour before sexual acitivity (Patient not taking: Reported on 01/15/2019) 10 tablet 0   No current facility-administered medications for this visit.      REVIEW OF SYSTEMS (Negative unless checked)  Constitutional: [] Weight loss  [] Fever  [] Chills Cardiac: [] Chest pain   [] Chest pressure   [] Palpitations   [] Shortness of breath when laying flat   [] Shortness of breath at rest   [] Shortness of breath with exertion. Vascular:  [] Pain in legs with walking   [] Pain in legs at rest   [] Pain in legs when laying flat   [] Claudication   [] Pain in feet when walking  [] Pain in feet at rest  [] Pain in feet when laying  flat   [] History of DVT   [] Phlebitis   [] Swelling in legs   [] Varicose veins   [] Non-healing ulcers Pulmonary:   [] Uses home oxygen   [] Productive cough   [] Hemoptysis   [] Wheeze  [] COPD   [] Asthma Neurologic:  [] Dizziness  [] Blackouts   [] Seizures   [] History of stroke   [] History of TIA  [] Aphasia   [] Temporary blindness   [] Dysphagia   [] Weakness or numbness in arms   [x] Weakness or numbness in legs Musculoskeletal:  [] Arthritis   [] Joint swelling   [] Joint pain   [] Low back pain Hematologic:  [] Easy bruising  [] Easy bleeding   [] Hypercoagulable state   [] Anemic  [] Hepatitis Gastrointestinal:  [] Blood in stool   [] Vomiting blood  [] Gastroesophageal reflux/heartburn   [] Abdominal pain Genitourinary:  [] Chronic kidney disease   [] Difficult urination  [] Frequent urination  [] Burning with urination   [] Hematuria Skin:  [] Rashes   [] Ulcers    [] Wounds Psychological:  [] History of anxiety   []  History of major depression.    Physical Exam BP (!) 158/94 (BP Location: Right Arm)   Pulse 98   Resp 16   Ht 5\' 10"  (1.778 m)   Wt 217 lb 9.6 oz (98.7 kg)   BMI 31.22 kg/m  Gen:  WD/WN, NAD Head: [place]/AT, No temporalis wasting.  Ear/Nose/Throat: Hearing grossly intact, nares w/o erythema or drainage, oropharynx w/o Erythema/Exudate Eyes: Conjunctiva clear, sclera non-icteric  Neck: trachea midline.  No JVD.  Pulmonary:  Good air movement, respirations not labored, no use of accessory muscles  Cardiac: RRR, no JVD Vascular:  Vessel Right Left  Radial Palpable Palpable                          DP  1+  2+  PT  2+  2+   Gastrointestinal:. No masses, surgical incisions, or scars. Musculoskeletal: M/S 5/5 throughout.  Extremities without ischemic changes.  No deformity or atrophy.  No lower extremity edema. Neurologic: Sensation grossly intact in extremities.  Symmetrical.  Speech is fluent. Motor exam as listed above. Psychiatric: Judgment intact, Mood & affect appropriate for pt's clinical situation. Dermatologic: No rashes or ulcers noted.  No cellulitis or open wounds.    Radiology No results found.  Labs No results found for this or any previous visit (from the past 2160 hour(s)).  Assessment/Plan:  Pure hypercholesterolemia lipid control important in reducing the progression of atherosclerotic disease. Continue statin therapy   HTN (hypertension) blood pressure control important in reducing the progression of atherosclerotic disease. On appropriate oral medications.   Numbness and tingling of right leg Patient has numbness and tingling on the right leg that are not entirely clear in its etiology.  At this point, given his symptoms I think neuropathy is the most likely source.  I think it is certainly reasonable to assess him for vascular disease particularly given multiple atherosclerotic risk factors although  his symptoms are not entirely typical for vascular disease.  Noninvasive arterial and venous studies to be done in the near future at his convenience.  We have discussed the pathophysiology and natural history of vascular disease.  We will see him back following the studies      Leotis Pain 09/05/2019, 10:48 AM   This note was created with Dragon medical transcription system.  Any errors from dictation are unintentional.

## 2019-09-05 NOTE — Patient Instructions (Signed)
Peripheral Vascular Disease  Peripheral vascular disease (PVD) is a disease of the blood vessels that are not part of your heart and brain. A simple term for PVD is poor circulation. In most cases, PVD narrows the blood vessels that carry blood from your heart to the rest of your body. This can reduce the supply of blood to your arms, legs, and internal organs, like your stomach or kidneys. However, PVD most often affects a person's lower legs and feet. Without treatment, PVD tends to get worse. PVD can also lead to acute ischemic limb. This is when an arm or leg suddenly cannot get enough blood. This is a medical emergency. Follow these instructions at home: Lifestyle  Do not use any products that contain nicotine or tobacco, such as cigarettes and e-cigarettes. If you need help quitting, ask your doctor.  Lose weight if you are overweight. Or, stay at a healthy weight as told by your doctor.  Eat a diet that is low in fat and cholesterol. If you need help, ask your doctor.  Exercise regularly. Ask your doctor for activities that are right for you. General instructions  Take over-the-counter and prescription medicines only as told by your doctor.  Take good care of your feet: ? Wear comfortable shoes that fit well. ? Check your feet often for any cuts or sores.  Keep all follow-up visits as told by your doctor This is important. Contact a doctor if:  You have cramps in your legs when you walk.  You have leg pain when you are at rest.  You have coldness in a leg or foot.  Your skin changes.  You are unable to get or have an erection (erectile dysfunction).  You have cuts or sores on your feet that do not heal. Get help right away if:  Your arm or leg turns cold, numb, and blue.  Your arms or legs become red, warm, swollen, painful, or numb.  You have chest pain.  You have trouble breathing.  You suddenly have weakness in your face, arm, or leg.  You become very  confused or you cannot speak.  You suddenly have a very bad headache.  You suddenly cannot see. Summary  Peripheral vascular disease (PVD) is a disease of the blood vessels.  A simple term for PVD is poor circulation. Without treatment, PVD tends to get worse.  Treatment may include exercise, low fat and low cholesterol diet, and quitting smoking. This information is not intended to replace advice given to you by your health care provider. Make sure you discuss any questions you have with your health care provider. Document Revised: 06/01/2017 Document Reviewed: 07/27/2016 Elsevier Patient Education  2020 [employer]vier Inc.  

## 2019-09-05 NOTE — Assessment & Plan Note (Signed)
Patient has numbness and tingling on the right leg that are not entirely clear in its etiology.  At this point, given his symptoms I think neuropathy is the most likely source.  I think it is certainly reasonable to assess him for vascular disease particularly given multiple atherosclerotic risk factors although his symptoms are not entirely typical for vascular disease.  Noninvasive arterial and venous studies to be done in the near future at his convenience.  We have discussed the pathophysiology and natural history of vascular disease.  We will see him back following the studies

## 2019-09-05 NOTE — Assessment & Plan Note (Signed)
lipid control important in reducing the progression of atherosclerotic disease. Continue statin therapy  

## 2019-09-15 ENCOUNTER — Other Ambulatory Visit: Payer: Self-pay | Admitting: Infectious Diseases

## 2019-09-15 DIAGNOSIS — R202 Paresthesia of skin: Secondary | ICD-10-CM

## 2019-09-15 DIAGNOSIS — R29898 Other symptoms and signs involving the musculoskeletal system: Secondary | ICD-10-CM

## 2019-09-20 ENCOUNTER — Other Ambulatory Visit: Payer: Managed Care, Other (non HMO)

## 2019-10-02 ENCOUNTER — Ambulatory Visit (INDEPENDENT_AMBULATORY_CARE_PROVIDER_SITE_OTHER): Payer: Managed Care, Other (non HMO) | Admitting: Nurse Practitioner

## 2019-10-02 ENCOUNTER — Encounter (INDEPENDENT_AMBULATORY_CARE_PROVIDER_SITE_OTHER): Payer: Managed Care, Other (non HMO)

## 2019-11-24 ENCOUNTER — Telehealth: Payer: Self-pay | Admitting: *Deleted

## 2019-11-24 NOTE — Telephone Encounter (Signed)
Patient rereferred for lung screening scan. Contacted and rescheduled.

## 2019-12-09 ENCOUNTER — Inpatient Hospital Stay: Payer: Managed Care, Other (non HMO) | Attending: Oncology | Admitting: Hospice and Palliative Medicine

## 2019-12-09 ENCOUNTER — Ambulatory Visit
Admission: RE | Admit: 2019-12-09 | Discharge: 2019-12-09 | Disposition: A | Discharge status: Home / Self Care | Payer: Managed Care, Other (non HMO) | Source: Ambulatory Visit | Attending: Oncology | Admitting: Oncology

## 2019-12-09 ENCOUNTER — Other Ambulatory Visit: Payer: Self-pay

## 2019-12-09 DIAGNOSIS — Z87891 Personal history of nicotine dependence: Secondary | ICD-10-CM | POA: Insufficient documentation

## 2019-12-09 NOTE — Progress Notes (Signed)
In accordance with CMS guidelines, patient has met eligibility criteria including age, absence of signs or symptoms of lung cancer.  Social History   Tobacco Use  . Smoking status: Current Every Day Smoker    Packs/day: 1.50    Years: 33.00    Pack years: 49.50    Types: Cigarettes  . Smokeless tobacco: Never Used  Substance Use Topics  . Alcohol use: No  . Drug use: No      A shared decision-making session was conducted prior to the performance of CT scan. This includes one or more decision aids, includes benefits and harms of screening, follow-up diagnostic testing, over-diagnosis, false positive rate, and total radiation exposure.   Counseling on the importance of adherence to annual lung cancer LDCT screening, impact of co-morbidities, and ability or willingness to undergo diagnosis and treatment is imperative for compliance of the program.   Counseling on the importance of continued smoking cessation for former smokers; the importance of smoking cessation for current smokers, and information about tobacco cessation interventions have been given to patient including [place] and [facility] quit [place] programs.   Written order for lung cancer screening with LDCT has been given to the patient and any and all questions have been answered to the best of my abilities.    Yearly follow up will be coordinated by Burgess Estelle, Thoracic Navigator.  Time Total: 15 minutes  Visit consisted of counseling and education dealing with complex health screening. Greater than 50%  of this time was spent counseling and coordinating care related to the above assessment and plan.  Signed by: Altha Harm, PhD, NP-C [phone] (Work Cell)

## 2019-12-12 ENCOUNTER — Encounter: Payer: Self-pay | Admitting: *Deleted

## 2020-03-02 ENCOUNTER — Other Ambulatory Visit: Payer: Self-pay | Admitting: Physician Assistant

## 2020-03-02 DIAGNOSIS — E785 Hyperlipidemia, unspecified: Secondary | ICD-10-CM

## 2020-03-02 NOTE — Telephone Encounter (Signed)
Requested medication (s) are due for refill today: yes   Requested medication (s) are on the active medication list: yes   Last refill: 12/03/2019  Future visit scheduled: no  Notes to clinic: overdue for follow up    Requested Prescriptions  Pending Prescriptions Disp Refills   atorvastatin (LIPITOR) 80 MG tablet [Pharmacy Med Name: ATORVASTATIN 80MG  TABLETS] 90 tablet 3    Sig: TAKE 1 TABLET(80 MG) BY MOUTH DAILY      Cardiovascular:  Antilipid - Statins Failed - 03/02/2020 11:07 AM      Failed - Total Cholesterol in normal range and within 360 days    Cholesterol, Total  Date Value Ref Range Status  02/04/2016 140 100 - 199 mg/dL Final          Failed - LDL in normal range and within 360 days    LDL Calculated  Date Value Ref Range Status  02/04/2016 77 0 - 99 mg/dL Final          Failed - HDL in normal range and within 360 days    HDL  Date Value Ref Range Status  02/04/2016 35 (L) >39 mg/dL Final          Failed - Triglycerides in normal range and within 360 days    Triglycerides  Date Value Ref Range Status  02/04/2016 140 0 - 149 mg/dL Final          Failed - Valid encounter within last 12 months    Recent Outpatient Visits           1 year ago Essential hypertension   [facility] [place], [place], PA-C   1 year ago Class 1 obesity due to excess calories with serious comorbidity and body mass index (BMI) of 31.0 to 31.9 in adult   [facility] [place], [facility], Alessandra Bevels   1 year ago Encounter for smoking cessation counseling   [facility] [place], [place], [place]   2 years ago Cramping of hands   [facility] [place], [place], [place]   2 years ago Acute viral syndrome   [facility] [place], [place]              [place] - Patient is not pregnant

## 2020-07-13 IMAGING — CT CT CHEST LUNG CANCER SCREENING LOW DOSE W/O CM
2 of 5 series · 15 of 40 positions shown, 18 images · non-contrast
Comparison: 11/12/2019 chest radiograph, report only.

CLINICAL DATA: Forty-nine pack-year smoking history. Current
smoker.

EXAM:
CT CHEST WITHOUT CONTRAST LOW-DOSE FOR LUNG CANCER SCREENING
TECHNIQUE: Multidetector CT imaging of the chest was performed following the
standard protocol without IV contrast.

[Series 3: lung 1.00 · axial · 0.81mm/px · z∈[+1649,+1950]mm · 12 of 333 slices shown, 15 images]
[im 16/333  mediastinal]
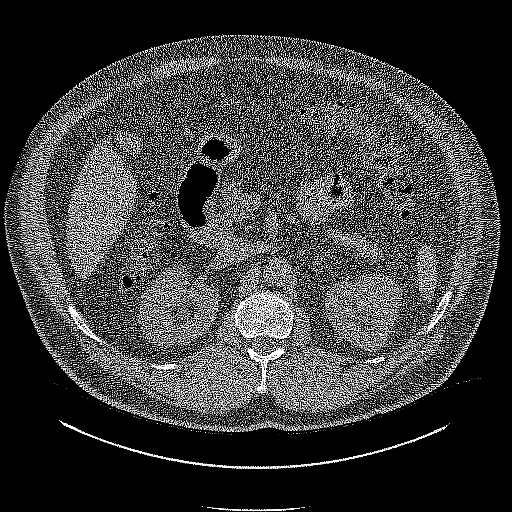
[im 16/333  lung]
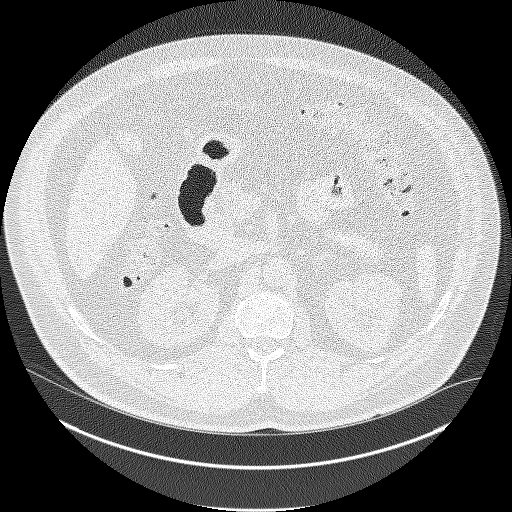
[im 46/333  lung]
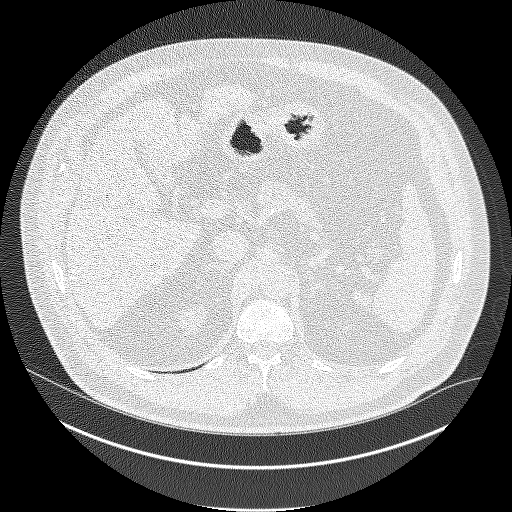
[im 76/333  lung]
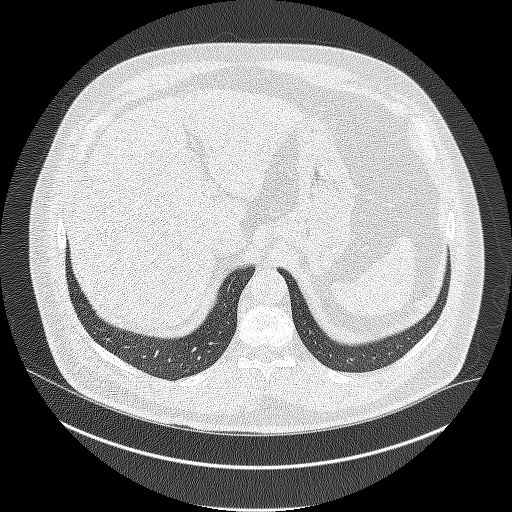
[im 106/333  lung]
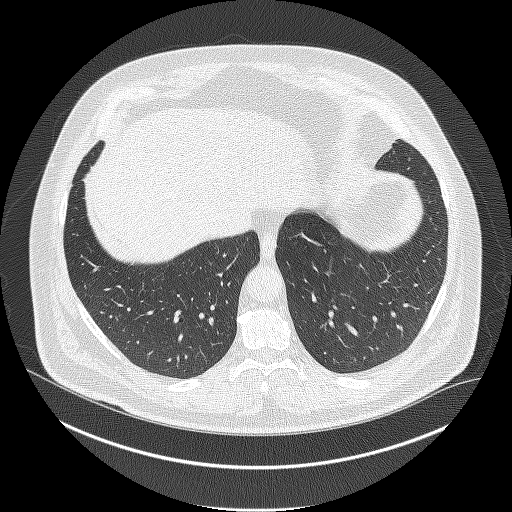
[im 121/333  mediastinal]
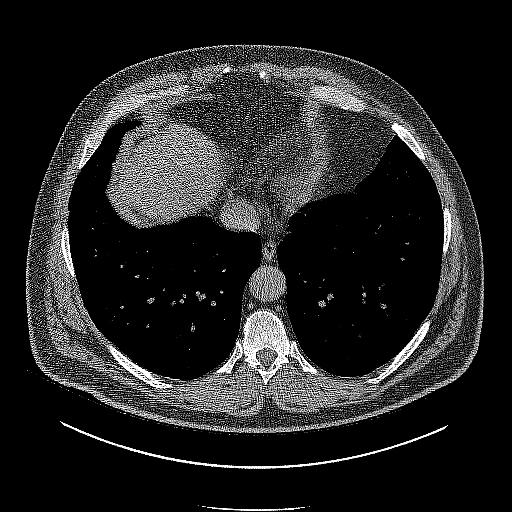
[im 121/333  lung]
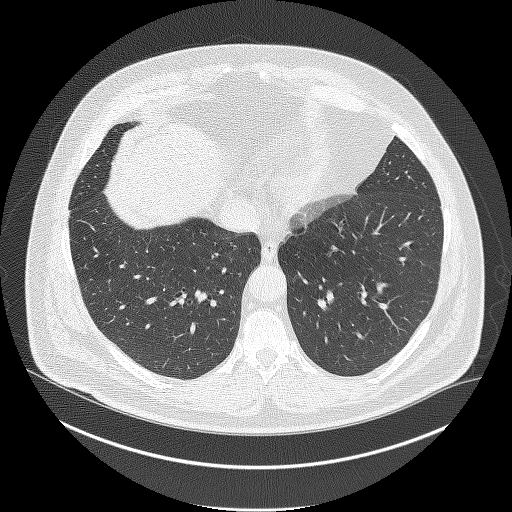
[im 151/333  lung]
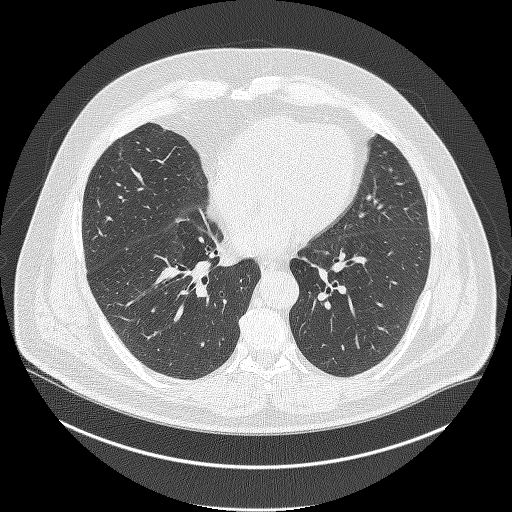
[im 182/333  lung]
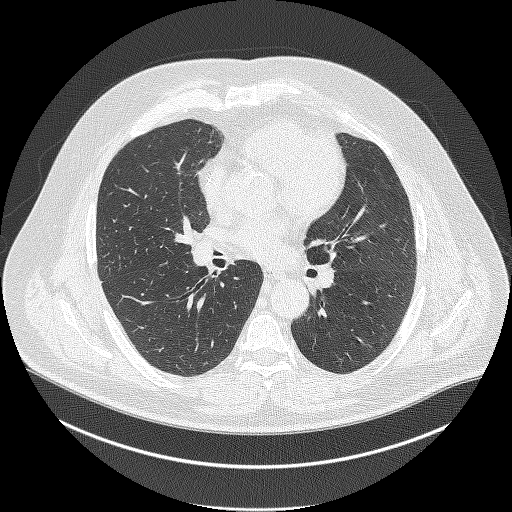
[im 212/333  lung]
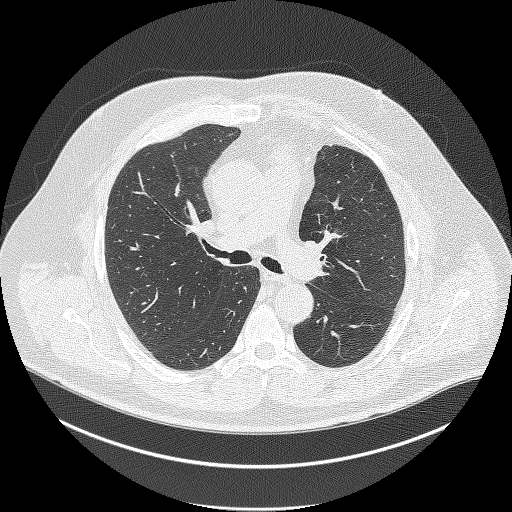
[im 227/333  mediastinal]
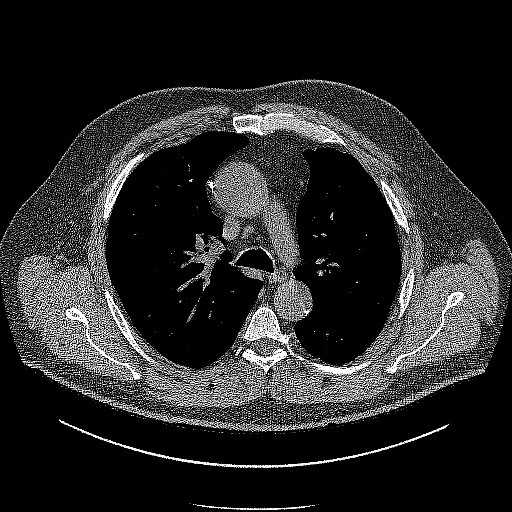
[im 227/333  lung]
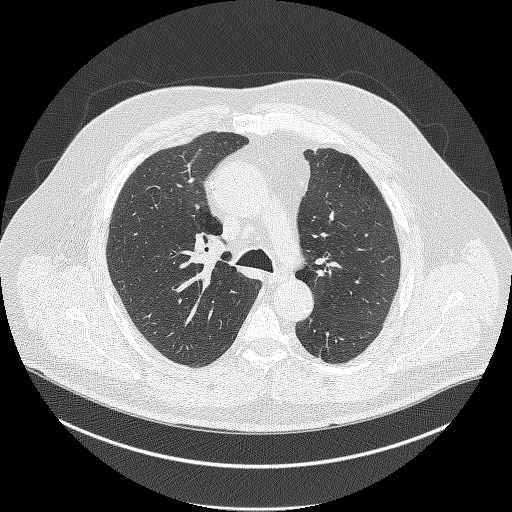
[im 257/333  lung]
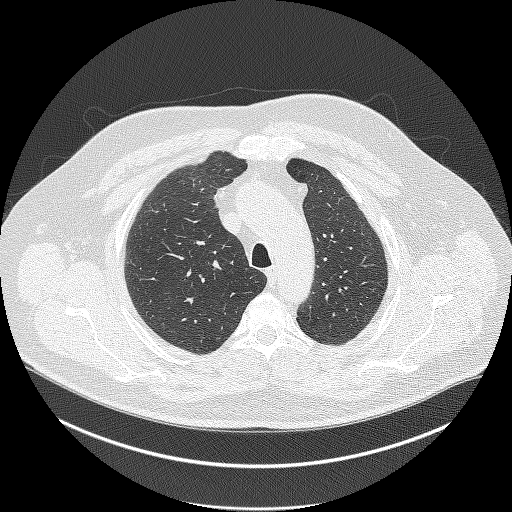
[im 287/333  lung]
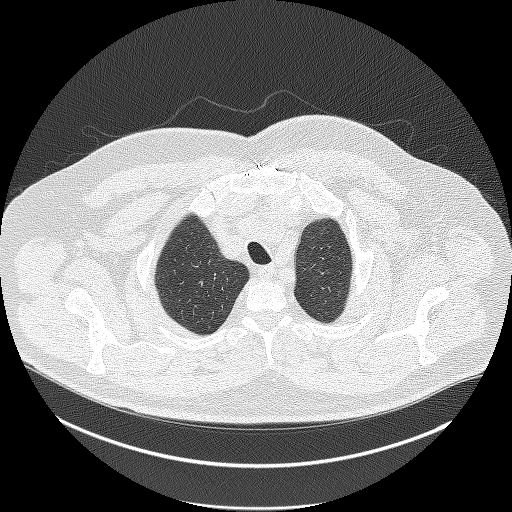
[im 317/333  lung]
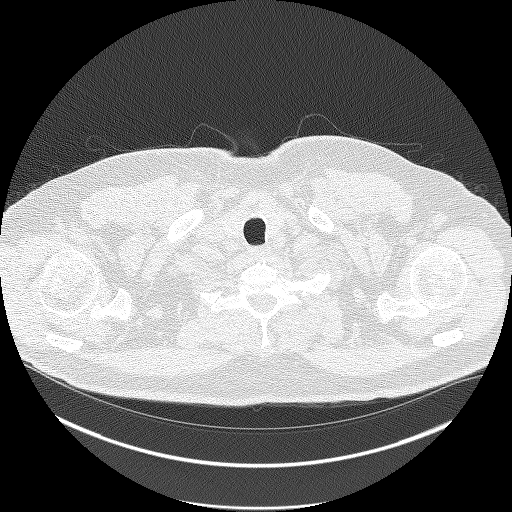

[Series 4: coronals lung 1.00 cor · coronal · 0.65mm/px · 3 of 412 slices shown]
[im 83/412  lung]
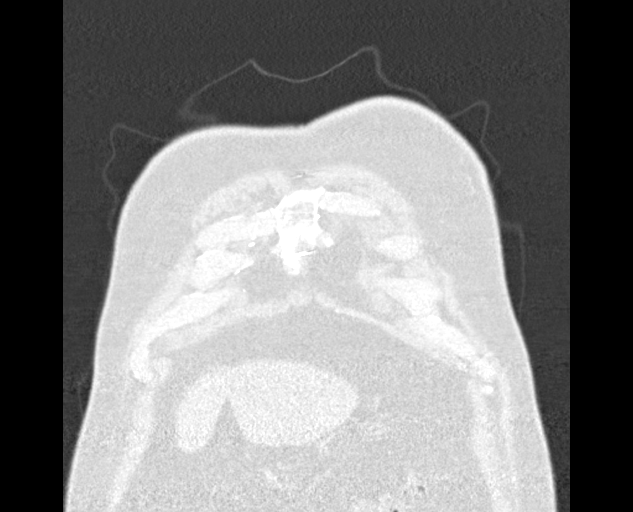
[im 165/412  lung]
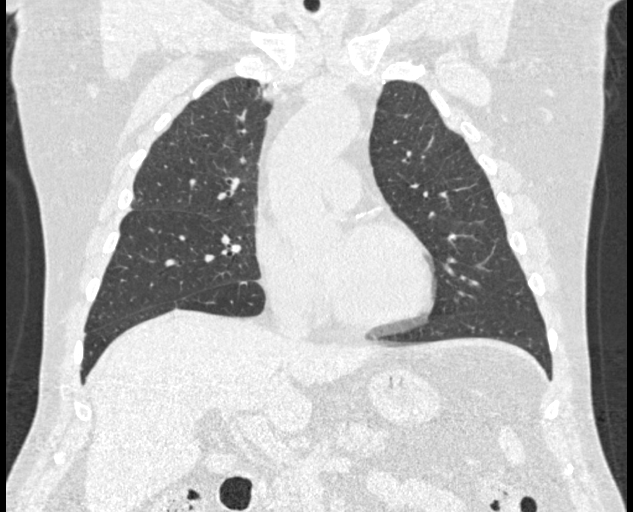
[im 247/412  lung]
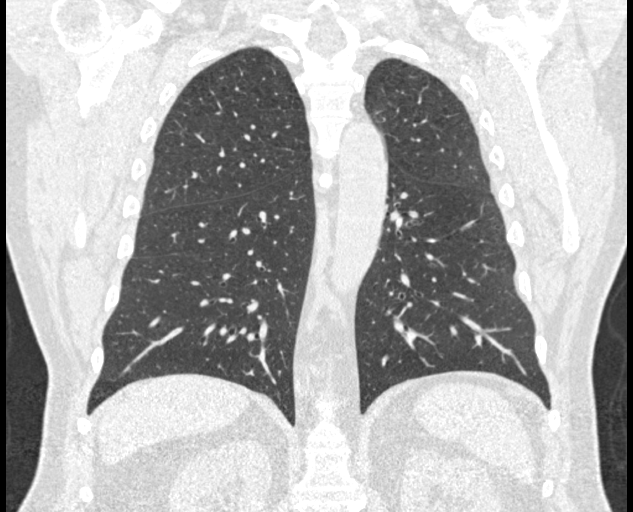

[15 of 40 positions shown; findings below may reference images not displayed]

FINDINGS: Cardiovascular: Aortic atherosclerosis. Tortuous thoracic aorta.
Normal heart size, without pericardial effusion. Median sternotomy
for CABG with native coronary artery atherosclerosis.

Mediastinum/Nodes: No mediastinal or definite hilar adenopathy,
given limitations of unenhanced CT.

Lungs/Pleura: No pleural fluid. Mild centrilobular emphysema.
Isolated subpleural right upper lobe pulmonary nodule of volume
derived equivalent diameter 2.8 mm.

Upper Abdomen: Normal imaged portions of the liver, spleen, stomach,
pancreas, gallbladder, adrenal glands, left kidney. 4 mm interpolar
right renal collecting system calculus.

Musculoskeletal: Prior median sternotomy. Upper thoracic
spondylosis.
IMPRESSION: 1. Lung-RADS 2, benign appearance or behavior. Continue annual
screening with low-dose chest CT without contrast in 12 months.
2. Right nephrolithiasis.
3. Aortic Atherosclerosis (420U0-4P0.0) and Emphysema (420U0-X7O.N).

## 2020-12-08 ENCOUNTER — Telehealth: Payer: Self-pay

## 2020-12-08 NOTE — Telephone Encounter (Signed)
Left message for patient to notify them that it is time to schedule annual low dose lung cancer screening CT scan. Instructed patient to call back ([phone]) to verify information and schedule.  

## 2021-06-21 DIAGNOSIS — I10 Essential (primary) hypertension: Secondary | ICD-10-CM | POA: Diagnosis not present

## 2021-06-21 DIAGNOSIS — E785 Hyperlipidemia, unspecified: Secondary | ICD-10-CM | POA: Diagnosis not present

## 2021-06-21 DIAGNOSIS — Z0000 Encounter for general adult medical examination without abnormal findings: Secondary | ICD-10-CM | POA: Diagnosis not present

## 2021-06-21 DIAGNOSIS — Z8673 Personal history of transient ischemic attack (TIA), and cerebral infarction without residual deficits: Secondary | ICD-10-CM | POA: Diagnosis not present

## 2021-06-21 DIAGNOSIS — G4700 Insomnia, unspecified: Secondary | ICD-10-CM | POA: Diagnosis not present

## 2022-01-25 ENCOUNTER — Other Ambulatory Visit: Payer: Self-pay | Admitting: *Deleted

## 2022-01-25 DIAGNOSIS — F17210 Nicotine dependence, cigarettes, uncomplicated: Secondary | ICD-10-CM

## 2022-01-25 DIAGNOSIS — Z122 Encounter for screening for malignant neoplasm of respiratory organs: Secondary | ICD-10-CM

## 2022-01-25 DIAGNOSIS — Z87891 Personal history of nicotine dependence: Secondary | ICD-10-CM

## 2022-02-02 ENCOUNTER — Ambulatory Visit: Payer: Medicare HMO | Attending: Acute Care

## 2022-02-17 ENCOUNTER — Telehealth: Payer: Self-pay | Admitting: *Deleted

## 2022-02-17 NOTE — Telephone Encounter (Signed)
Left message for pt to call back to reschedule lung screening CT scan.  ?

## 2022-04-21 DIAGNOSIS — F411 Generalized anxiety disorder: Secondary | ICD-10-CM | POA: Diagnosis not present

## 2022-04-21 DIAGNOSIS — R7303 Prediabetes: Secondary | ICD-10-CM | POA: Diagnosis not present

## 2022-04-21 DIAGNOSIS — E785 Hyperlipidemia, unspecified: Secondary | ICD-10-CM | POA: Diagnosis not present

## 2022-04-21 DIAGNOSIS — F5105 Insomnia due to other mental disorder: Secondary | ICD-10-CM | POA: Diagnosis not present

## 2022-04-21 DIAGNOSIS — F419 Anxiety disorder, unspecified: Secondary | ICD-10-CM | POA: Diagnosis not present

## 2022-04-21 DIAGNOSIS — Z125 Encounter for screening for malignant neoplasm of prostate: Secondary | ICD-10-CM | POA: Diagnosis not present

## 2022-04-21 DIAGNOSIS — I2510 Atherosclerotic heart disease of native coronary artery without angina pectoris: Secondary | ICD-10-CM | POA: Diagnosis not present

## 2022-04-21 DIAGNOSIS — I25118 Atherosclerotic heart disease of native coronary artery with other forms of angina pectoris: Secondary | ICD-10-CM | POA: Diagnosis not present

## 2022-04-21 DIAGNOSIS — F17200 Nicotine dependence, unspecified, uncomplicated: Secondary | ICD-10-CM | POA: Diagnosis not present

## 2022-04-21 DIAGNOSIS — Z720 Tobacco use: Secondary | ICD-10-CM | POA: Diagnosis not present

## 2022-04-21 DIAGNOSIS — F5101 Primary insomnia: Secondary | ICD-10-CM | POA: Diagnosis not present

## 2022-04-21 DIAGNOSIS — I10 Essential (primary) hypertension: Secondary | ICD-10-CM | POA: Diagnosis not present

## 2022-04-21 DIAGNOSIS — E7849 Other hyperlipidemia: Secondary | ICD-10-CM | POA: Diagnosis not present

## 2022-04-21 DIAGNOSIS — Z8673 Personal history of transient ischemic attack (TIA), and cerebral infarction without residual deficits: Secondary | ICD-10-CM | POA: Diagnosis not present

## 2022-05-01 ENCOUNTER — Encounter (INDEPENDENT_AMBULATORY_CARE_PROVIDER_SITE_OTHER): Payer: Self-pay

## 2023-04-24 DIAGNOSIS — Z8673 Personal history of transient ischemic attack (TIA), and cerebral infarction without residual deficits: Secondary | ICD-10-CM | POA: Diagnosis not present

## 2023-04-24 DIAGNOSIS — Z720 Tobacco use: Secondary | ICD-10-CM | POA: Diagnosis not present

## 2023-04-24 DIAGNOSIS — R7303 Prediabetes: Secondary | ICD-10-CM | POA: Diagnosis not present

## 2023-04-24 DIAGNOSIS — E785 Hyperlipidemia, unspecified: Secondary | ICD-10-CM | POA: Diagnosis not present

## 2023-04-24 DIAGNOSIS — Z1331 Encounter for screening for depression: Secondary | ICD-10-CM | POA: Diagnosis not present

## 2023-04-24 DIAGNOSIS — I25118 Atherosclerotic heart disease of native coronary artery with other forms of angina pectoris: Secondary | ICD-10-CM | POA: Diagnosis not present

## 2023-04-24 DIAGNOSIS — Z0000 Encounter for general adult medical examination without abnormal findings: Secondary | ICD-10-CM | POA: Diagnosis not present

## 2023-04-24 DIAGNOSIS — G4700 Insomnia, unspecified: Secondary | ICD-10-CM | POA: Diagnosis not present

## 2023-04-24 DIAGNOSIS — E7849 Other hyperlipidemia: Secondary | ICD-10-CM | POA: Diagnosis not present

## 2023-04-24 DIAGNOSIS — F5101 Primary insomnia: Secondary | ICD-10-CM | POA: Diagnosis not present

## 2023-04-24 DIAGNOSIS — F419 Anxiety disorder, unspecified: Secondary | ICD-10-CM | POA: Diagnosis not present

## 2023-04-24 DIAGNOSIS — F17210 Nicotine dependence, cigarettes, uncomplicated: Secondary | ICD-10-CM | POA: Diagnosis not present

## 2023-04-24 DIAGNOSIS — Z125 Encounter for screening for malignant neoplasm of prostate: Secondary | ICD-10-CM | POA: Diagnosis not present

## 2023-04-24 DIAGNOSIS — F411 Generalized anxiety disorder: Secondary | ICD-10-CM | POA: Diagnosis not present

## 2023-04-24 DIAGNOSIS — I2510 Atherosclerotic heart disease of native coronary artery without angina pectoris: Secondary | ICD-10-CM | POA: Diagnosis not present

## 2023-04-24 DIAGNOSIS — I10 Essential (primary) hypertension: Secondary | ICD-10-CM | POA: Diagnosis not present

## 2023-04-24 DIAGNOSIS — Z122 Encounter for screening for malignant neoplasm of respiratory organs: Secondary | ICD-10-CM | POA: Diagnosis not present

## 2023-04-25 DIAGNOSIS — I25118 Atherosclerotic heart disease of native coronary artery with other forms of angina pectoris: Secondary | ICD-10-CM | POA: Diagnosis not present

## 2023-04-25 DIAGNOSIS — F411 Generalized anxiety disorder: Secondary | ICD-10-CM | POA: Diagnosis not present

## 2023-04-25 DIAGNOSIS — Z720 Tobacco use: Secondary | ICD-10-CM | POA: Diagnosis not present

## 2023-04-25 DIAGNOSIS — Z8673 Personal history of transient ischemic attack (TIA), and cerebral infarction without residual deficits: Secondary | ICD-10-CM | POA: Diagnosis not present

## 2023-04-25 DIAGNOSIS — F5101 Primary insomnia: Secondary | ICD-10-CM | POA: Diagnosis not present

## 2023-04-25 DIAGNOSIS — E7849 Other hyperlipidemia: Secondary | ICD-10-CM | POA: Diagnosis not present

## 2023-04-25 DIAGNOSIS — R7303 Prediabetes: Secondary | ICD-10-CM | POA: Diagnosis not present

## 2023-04-25 DIAGNOSIS — I10 Essential (primary) hypertension: Secondary | ICD-10-CM | POA: Diagnosis not present

## 2023-04-25 DIAGNOSIS — F419 Anxiety disorder, unspecified: Secondary | ICD-10-CM | POA: Diagnosis not present

## 2023-04-29 DIAGNOSIS — Z1211 Encounter for screening for malignant neoplasm of colon: Secondary | ICD-10-CM | POA: Diagnosis not present

## 2023-04-29 DIAGNOSIS — Z1212 Encounter for screening for malignant neoplasm of rectum: Secondary | ICD-10-CM | POA: Diagnosis not present

## 2023-05-01 ENCOUNTER — Other Ambulatory Visit: Payer: Self-pay

## 2023-05-01 DIAGNOSIS — Z87891 Personal history of nicotine dependence: Secondary | ICD-10-CM

## 2023-05-01 DIAGNOSIS — Z122 Encounter for screening for malignant neoplasm of respiratory organs: Secondary | ICD-10-CM

## 2023-05-01 DIAGNOSIS — F17210 Nicotine dependence, cigarettes, uncomplicated: Secondary | ICD-10-CM

## 2023-05-07 LAB — EXTERNAL GENERIC LAB PROCEDURE: COLOGUARD: POSITIVE — AB

## 2023-05-07 LAB — COLOGUARD: COLOGUARD: POSITIVE — AB

## 2023-05-21 ENCOUNTER — Ambulatory Visit
Admission: RE | Admit: 2023-05-21 | Discharge: 2023-05-21 | Disposition: A | Discharge status: Home / Self Care | Payer: Medicare HMO | Source: Ambulatory Visit | Attending: Infectious Diseases | Admitting: Infectious Diseases

## 2023-05-21 DIAGNOSIS — Z122 Encounter for screening for malignant neoplasm of respiratory organs: Secondary | ICD-10-CM | POA: Diagnosis not present

## 2023-05-21 DIAGNOSIS — F17210 Nicotine dependence, cigarettes, uncomplicated: Secondary | ICD-10-CM | POA: Insufficient documentation

## 2023-05-21 DIAGNOSIS — Z87891 Personal history of nicotine dependence: Secondary | ICD-10-CM | POA: Diagnosis not present

## 2023-06-12 ENCOUNTER — Other Ambulatory Visit: Payer: Self-pay

## 2023-06-12 DIAGNOSIS — Z87891 Personal history of nicotine dependence: Secondary | ICD-10-CM

## 2023-06-12 DIAGNOSIS — Z122 Encounter for screening for malignant neoplasm of respiratory organs: Secondary | ICD-10-CM

## 2023-06-12 DIAGNOSIS — F17210 Nicotine dependence, cigarettes, uncomplicated: Secondary | ICD-10-CM

## 2023-10-23 DIAGNOSIS — G4700 Insomnia, unspecified: Secondary | ICD-10-CM | POA: Diagnosis not present

## 2023-10-23 DIAGNOSIS — T251 Burn of first degree of ankle and foot: Secondary | ICD-10-CM | POA: Diagnosis not present

## 2023-10-23 DIAGNOSIS — I2510 Atherosclerotic heart disease of native coronary artery without angina pectoris: Secondary | ICD-10-CM | POA: Diagnosis not present

## 2023-10-23 DIAGNOSIS — Z8673 Personal history of transient ischemic attack (TIA), and cerebral infarction without residual deficits: Secondary | ICD-10-CM | POA: Diagnosis not present

## 2023-10-23 DIAGNOSIS — J439 Emphysema, unspecified: Secondary | ICD-10-CM | POA: Diagnosis not present

## 2023-10-23 DIAGNOSIS — F17210 Nicotine dependence, cigarettes, uncomplicated: Secondary | ICD-10-CM | POA: Diagnosis not present

## 2023-10-23 DIAGNOSIS — E785 Hyperlipidemia, unspecified: Secondary | ICD-10-CM | POA: Diagnosis not present

## 2023-10-23 DIAGNOSIS — Z0000 Encounter for general adult medical examination without abnormal findings: Secondary | ICD-10-CM | POA: Diagnosis not present

## 2023-10-23 DIAGNOSIS — R195 Other fecal abnormalities: Secondary | ICD-10-CM | POA: Diagnosis not present

## 2023-10-23 DIAGNOSIS — I10 Essential (primary) hypertension: Secondary | ICD-10-CM | POA: Diagnosis not present

## 2023-10-23 DIAGNOSIS — R7303 Prediabetes: Secondary | ICD-10-CM | POA: Diagnosis not present

## 2024-04-25 DIAGNOSIS — Z8673 Personal history of transient ischemic attack (TIA), and cerebral infarction without residual deficits: Secondary | ICD-10-CM | POA: Diagnosis not present

## 2024-04-25 DIAGNOSIS — E785 Hyperlipidemia, unspecified: Secondary | ICD-10-CM | POA: Diagnosis not present

## 2024-04-25 DIAGNOSIS — I2510 Atherosclerotic heart disease of native coronary artery without angina pectoris: Secondary | ICD-10-CM | POA: Diagnosis not present

## 2024-04-25 DIAGNOSIS — Z1331 Encounter for screening for depression: Secondary | ICD-10-CM | POA: Diagnosis not present

## 2024-04-25 DIAGNOSIS — R195 Other fecal abnormalities: Secondary | ICD-10-CM | POA: Diagnosis not present

## 2024-04-25 DIAGNOSIS — G4700 Insomnia, unspecified: Secondary | ICD-10-CM | POA: Diagnosis not present

## 2024-04-25 DIAGNOSIS — I10 Essential (primary) hypertension: Secondary | ICD-10-CM | POA: Diagnosis not present

## 2024-04-25 DIAGNOSIS — F419 Anxiety disorder, unspecified: Secondary | ICD-10-CM | POA: Diagnosis not present

## 2024-04-25 DIAGNOSIS — R7303 Prediabetes: Secondary | ICD-10-CM | POA: Diagnosis not present
